# Patient Record
Sex: Male | Born: 1950 | Race: White | Hispanic: No | Marital: Married | Smoking: Former smoker
Health system: Southern US, Community
[De-identification: ages and names within clinical notes are randomized; demographics above are authoritative.]

## PROBLEM LIST (undated history)

## (undated) DIAGNOSIS — I10 Essential (primary) hypertension: Secondary | ICD-10-CM

## (undated) DIAGNOSIS — I519 Heart disease, unspecified: Secondary | ICD-10-CM

## (undated) DIAGNOSIS — Z972 Presence of dental prosthetic device (complete) (partial): Secondary | ICD-10-CM

## (undated) DIAGNOSIS — K269 Duodenal ulcer, unspecified as acute or chronic, without hemorrhage or perforation: Secondary | ICD-10-CM

## (undated) DIAGNOSIS — I509 Heart failure, unspecified: Secondary | ICD-10-CM

## (undated) DIAGNOSIS — M1990 Unspecified osteoarthritis, unspecified site: Secondary | ICD-10-CM

## (undated) DIAGNOSIS — K219 Gastro-esophageal reflux disease without esophagitis: Secondary | ICD-10-CM

## (undated) DIAGNOSIS — R1030 Lower abdominal pain, unspecified: Secondary | ICD-10-CM

## (undated) DIAGNOSIS — D126 Benign neoplasm of colon, unspecified: Secondary | ICD-10-CM

## (undated) DIAGNOSIS — E7800 Pure hypercholesterolemia, unspecified: Secondary | ICD-10-CM

## (undated) DIAGNOSIS — R7303 Prediabetes: Secondary | ICD-10-CM

## (undated) DIAGNOSIS — Z87898 Personal history of other specified conditions: Secondary | ICD-10-CM

## (undated) DIAGNOSIS — I6529 Occlusion and stenosis of unspecified carotid artery: Secondary | ICD-10-CM

## (undated) DIAGNOSIS — B1910 Unspecified viral hepatitis B without hepatic coma: Secondary | ICD-10-CM

## (undated) DIAGNOSIS — N400 Enlarged prostate without lower urinary tract symptoms: Secondary | ICD-10-CM

## (undated) DIAGNOSIS — T4145XA Adverse effect of unspecified anesthetic, initial encounter: Secondary | ICD-10-CM

## (undated) DIAGNOSIS — M75100 Unspecified rotator cuff tear or rupture of unspecified shoulder, not specified as traumatic: Secondary | ICD-10-CM

## (undated) DIAGNOSIS — N189 Chronic kidney disease, unspecified: Secondary | ICD-10-CM

## (undated) DIAGNOSIS — D649 Anemia, unspecified: Secondary | ICD-10-CM

## (undated) DIAGNOSIS — T8859XA Other complications of anesthesia, initial encounter: Secondary | ICD-10-CM

## (undated) DIAGNOSIS — I4891 Unspecified atrial fibrillation: Secondary | ICD-10-CM

## (undated) DIAGNOSIS — C801 Malignant (primary) neoplasm, unspecified: Secondary | ICD-10-CM

## (undated) DIAGNOSIS — H409 Unspecified glaucoma: Secondary | ICD-10-CM

## (undated) DIAGNOSIS — I2510 Atherosclerotic heart disease of native coronary artery without angina pectoris: Secondary | ICD-10-CM

## (undated) DIAGNOSIS — K319 Disease of stomach and duodenum, unspecified: Secondary | ICD-10-CM

## (undated) HISTORY — PX: OTHER SURGICAL HISTORY: SHX169

## (undated) HISTORY — PX: COLONOSCOPY, ESOPHAGOGASTRODUODENOSCOPY (EGD) AND ESOPHAGEAL DILATION: SHX5781

## (undated) HISTORY — PX: TONSILLECTOMY: SUR1361

## (undated) HISTORY — DX: Atherosclerotic heart disease of native coronary artery without angina pectoris: I25.10

## (undated) HISTORY — DX: Heart disease, unspecified: I51.9

## (undated) HISTORY — PX: VASCULAR SURGERY: SHX849

---

## 2003-02-13 HISTORY — PX: OTHER SURGICAL HISTORY: SHX169

## 2004-03-12 LAB — HM COLONOSCOPY: HM Colonoscopy: NORMAL

## 2005-10-29 ENCOUNTER — Ambulatory Visit: Payer: Self-pay | Admitting: Unknown Physician Specialty

## 2007-02-13 HISTORY — PX: CARDIAC CATHETERIZATION: SHX172

## 2007-11-24 ENCOUNTER — Encounter: Admission: RE | Admit: 2007-11-24 | Discharge: 2007-11-24 | Payer: Self-pay | Admitting: Cardiovascular Disease

## 2007-11-27 ENCOUNTER — Inpatient Hospital Stay (HOSPITAL_COMMUNITY): Admission: AD | Admit: 2007-11-27 | Discharge: 2007-11-28 | Payer: Self-pay | Admitting: Cardiovascular Disease

## 2008-01-12 ENCOUNTER — Ambulatory Visit: Payer: Self-pay | Admitting: Orthopedic Surgery

## 2008-03-24 ENCOUNTER — Encounter: Payer: Self-pay | Admitting: Orthopedic Surgery

## 2008-04-12 ENCOUNTER — Encounter: Payer: Self-pay | Admitting: Orthopedic Surgery

## 2008-09-21 ENCOUNTER — Ambulatory Visit: Payer: Self-pay | Admitting: Unknown Physician Specialty

## 2010-06-27 NOTE — Cardiovascular Report (Signed)
[name], [name] NO.:  [id number]   MEDICAL RECORD NO.:  [id number]          PATIENT TYPE:  INP   LOCATION:  6524                         FACILITY:  [facility]   PHYSICIAN:  Nanetta Batty, M.D.   DATE OF BIRTH:  Jun 30, 1950   DATE OF PROCEDURE:  DATE OF DISCHARGE:                            CARDIAC CATHETERIZATION   Peter Baxter is a 60 year old gentleman, patient of Dr. Alanda Amass, who has  a history of CAD, status post remote circumflex stenting in the past.  He has had new onset angina and was cathed today by Dr. Alanda Amass at the  [facility] revealing high-grade disease in the AV groove  circumflex, first diagonal branch, and PDA.  He was transferred to [facility]  via EMS and presents now for elective PCI stenting.   DESCRIPTION OF PROCEDURE:  The patient received a gram of Rocephin IV  prophylactically.  His 4-French sheath was exchanged over a short wire  using double glove technique for a 7-French short sheath.  The patient  had already received 4 baby aspirin, 6 mg of p.o. Plavix.  He received  Angiomax bolus with an ACT of 293.  Visipaque dye was used for the  entirety of the case.  Retrograde aortic pressures were monitored during  the case.   Using a 7-French XB 3.5 guide catheter along with an 14190 Asahi soft  wire and a 2.0 x 12 Voyager balloon, PTCA was performed of the AV groove  circumflex.  Stenting was performed with a 2.25 x 16 Taxus Adam drug-  eluting stent at 16 atmospheres (2.43 mm) resulting reduction of 95%  stenosis to 0% residual.   The wire was then withdrawn and redirected down the first large diagonal  branch.  The diagonal was dilated with the 2-0 Voyager and stented with  a 2.25 x 16 Adam at 14-16 atmospheres resulting reduction of 95%  stenosis to 0% residual.   Following this, the RCA was then approached with a 7-French JR-4 guide  catheter along with an 14190 Asahi soft wire and a 2.0 x 12 Voyager  balloon.  This was  then predilated and stented with a 2.25 x 12 Taxus  Adam drug-eluting stent at 16 atmospheres (2.43 mm) resulting reduction  of 95% stenosis to 0% residual.  The patient tolerated the procedure  well.  No hemodynamic, electrocardiographic sequelae.  The guidewire and  catheter were removed.  The sheath was then secured in place.  The  patient left lab in stable condition.  He will be gently hydrated  overnight.  He will be treated with aspirin and Plavix and discharge him  on the morning.  He will follow up with Dr. Alanda Amass.  He left lab in  stable condition.      Nanetta Batty, M.D.  Electronically Signed     JB/MEDQ  D:  11/27/2007  T:  11/28/2007  Job:  [id number]   cc:   Second Floor [place]  [facility]  Richard A. Alanda Amass, M.D.  Diona Fanti

## 2010-06-27 NOTE — Discharge Summary (Signed)
Peter Baxter, Peter Baxter                ACCOUNT NO.:  [id number]   MEDICAL RECORD NO.:  [id number]          PATIENT TYPE:  INP   LOCATION:  6524                         FACILITY:  [facility]   PHYSICIAN:  Nanetta Batty, M.D.   DATE OF BIRTH:  Jun 10, 1950   DATE OF ADMISSION:  11/27/2007  DATE OF DISCHARGE:  11/28/2007                               DISCHARGE SUMMARY   DISCHARGE DIAGNOSES:  1. Angina.  2. Progressive coronary artery disease, status post cardiac      catheterization at [facility] on November 27, 2007, by      Dr. Susa Griffins revealing a new high-grade mid circumflex      95%, no restenosis of the proximal circumflex stent from July 1997,      new large diagonal 1 proximal segmented 85% lesion and then a new      proximal posterior descending artery lesion of 80-85%.  He had no      significant left anterior descending disease.  He had 40% proximal      and he had a dominant right coronary artery.  He was transferred to      [facility] for intervention.  3. Status post stenting by Dr. Nanetta Batty on November 27, 2007,      with a TAXUS stent 2.25 mm x 16 mm to the circumflex, a 2.25 mm x      16 mm to the diagonal and a 2.25 x 12 mm to the posterior      descending artery.  The patient was given aspirin and Plavix and he      also had Angiomax.  4. Hyperlipidemia.  5. Right bundle-branch block.  6. Family history of coronary artery disease.  7. Remote history of smoking.  The patient quit in 1995.  8. Gastroesophageal reflux disease on Nexium.   LABS POSTPROCEDURE:  Hemoglobin 14.3, hematocrit 42.3, WBC 8.4 and  platelets 152.  Sodium 141, potassium 4.0, chloride 109, CO2 26, glucose  92, BUN 9, creatinine 0.98, CK-MB 38/1.1 and a troponin of 0.08.  Chest  x-ray done on November 24, 2007, showed no acute findings.   DISCHARGE MEDICATIONS:  1. Metoprolol 25 mg twice a day.  2. Centrum 1 tablet every day.  3. Omega-3 1000 every day.  4. Aspirin 81  mg.  He was told to increase to 2 every day.  5. Nexium 40 mg a day.  6. Simvastatin 20 mg at bedtime.  7. Nitroglycerin p.r.n. for chest pain.  8. Plavix 75 mg a day.  He was told not to stop this.   DISCHARGE INSTRUCTIONS:  He will follow up with Dr. Alanda Amass on December 09, 2007 at 4:00 p.m.  He should do no driving for 3 days.  He was told  by Dr. Nanetta Batty he could return to work on Monday.  He should do  no strenuous activity, lifting, pushing, pulling, or exercise for 5  days.  He will attend cardiac rehab in [place].   HOSPITAL COURSE:  The patient was transferred from [facility] with high-grade disease in  three vessels, needing intervention  with a recent increase in angina.  He underwent procedure with TAXUS  stenting by  Dr. Nanetta Batty.  The following day, he was stable.  He was seen by  Dr. Allyson Sabal again and considered stable for discharge home.  His blood  pressure was 97/59, pulse was 60.  He had no chest pain or shortness of  breath.  His groin was okay and labs were all okay.      Lezlie Octave, N.P.      Nanetta Batty, M.D.  Electronically Signed    BB/MEDQ  D:  11/28/2007  T:  11/29/2007  Job:  [id number]   cc:   Dr. Clearance Coots

## 2010-11-14 LAB — BASIC METABOLIC PANEL
BUN: 9
CO2: 28
Calcium: 8.4
Calcium: 8.6
GFR calc non Af Amer: >60
GFR calc non Af Amer: >60
Glucose, Bld: 103 — ABNORMAL HIGH
Potassium: 3.7
Potassium: 4
Sodium: 141

## 2010-11-14 LAB — CBC
Hemoglobin: 14.3
Hemoglobin: 14.9
MCHC: 33.7
MCV: 98.4
MCV: 98.4
Platelets: 152
RDW: 13

## 2010-11-14 LAB — DIFFERENTIAL
Basophils Absolute: 0.1
Eosinophils Absolute: 0.1
Lymphocytes Relative: 23
Lymphs Abs: 1.4
Monocytes Relative: 7
Neutro Abs: 4.2

## 2010-11-14 LAB — CARDIAC PANEL(CRET KIN+CKTOT+MB+TROPI)
CK, MB: 1
CK, MB: 1.1
Relative Index: INVALID
Relative Index: INVALID
Total CK: 38

## 2010-11-14 LAB — PROTIME-INR: Prothrombin Time: 13.7

## 2012-01-15 ENCOUNTER — Other Ambulatory Visit (HOSPITAL_COMMUNITY): Payer: Self-pay | Admitting: Cardiovascular Disease

## 2012-01-15 DIAGNOSIS — I729 Aneurysm of unspecified site: Secondary | ICD-10-CM

## 2012-01-15 DIAGNOSIS — R0989 Other specified symptoms and signs involving the circulatory and respiratory systems: Secondary | ICD-10-CM

## 2012-02-04 ENCOUNTER — Encounter (HOSPITAL_COMMUNITY): Payer: Self-pay

## 2012-04-29 ENCOUNTER — Ambulatory Visit (HOSPITAL_COMMUNITY)
Admission: RE | Admit: 2012-04-29 | Discharge: 2012-04-29 | Disposition: A | Discharge status: Home / Self Care | Payer: BC Managed Care – PPO | Source: Ambulatory Visit | Attending: Cardiovascular Disease | Admitting: Cardiovascular Disease

## 2012-04-29 DIAGNOSIS — I729 Aneurysm of unspecified site: Secondary | ICD-10-CM

## 2012-04-29 DIAGNOSIS — R0989 Other specified symptoms and signs involving the circulatory and respiratory systems: Secondary | ICD-10-CM

## 2012-04-29 NOTE — Progress Notes (Signed)
Abdominal aortic duplex was completed Larene Pickett RVT

## 2012-04-29 NOTE — Progress Notes (Signed)
Carotid duplex doppler was completed. Larene Pickett RVT

## 2012-04-29 NOTE — Progress Notes (Deleted)
Carotid and abdominal aortic duplex doppler was completed Larene Pickett RVT

## 2012-06-27 ENCOUNTER — Encounter: Payer: Self-pay | Admitting: Cardiovascular Disease

## 2012-12-17 DIAGNOSIS — I2510 Atherosclerotic heart disease of native coronary artery without angina pectoris: Secondary | ICD-10-CM | POA: Insufficient documentation

## 2012-12-18 DIAGNOSIS — E782 Mixed hyperlipidemia: Secondary | ICD-10-CM | POA: Insufficient documentation

## 2012-12-18 DIAGNOSIS — I10 Essential (primary) hypertension: Secondary | ICD-10-CM | POA: Insufficient documentation

## 2013-01-06 ENCOUNTER — Ambulatory Visit: Payer: Self-pay | Admitting: Family Medicine

## 2013-11-04 DIAGNOSIS — R7303 Prediabetes: Secondary | ICD-10-CM | POA: Insufficient documentation

## 2014-06-11 ENCOUNTER — Ambulatory Visit (INDEPENDENT_AMBULATORY_CARE_PROVIDER_SITE_OTHER): Payer: Self-pay | Admitting: Nurse Practitioner

## 2014-06-11 ENCOUNTER — Encounter: Payer: Self-pay | Admitting: Nurse Practitioner

## 2014-06-11 ENCOUNTER — Encounter (INDEPENDENT_AMBULATORY_CARE_PROVIDER_SITE_OTHER): Payer: Self-pay

## 2014-06-11 VITALS — BP 101/62 | HR 60 | Temp 97.9°F | Resp 12 | Ht 68.0 in | Wt 181.1 lb

## 2014-06-11 DIAGNOSIS — M25511 Pain in right shoulder: Secondary | ICD-10-CM

## 2014-06-11 DIAGNOSIS — Z7189 Other specified counseling: Secondary | ICD-10-CM | POA: Diagnosis not present

## 2014-06-11 DIAGNOSIS — Z7689 Persons encountering health services in other specified circumstances: Secondary | ICD-10-CM

## 2014-06-11 DIAGNOSIS — M25512 Pain in left shoulder: Secondary | ICD-10-CM | POA: Diagnosis not present

## 2014-06-11 MED ORDER — PANTOPRAZOLE SODIUM 40 MG PO TBEC: 40.0000 mg | DELAYED_RELEASE_TABLET | Freq: Every day | ORAL | Status: AC

## 2014-06-11 NOTE — Patient Instructions (Addendum)
Tumeric 500 mg twice daily for 4-6 weeks for joints.   Lots of water and keep moving!   Welcome to [facility]!

## 2014-06-11 NOTE — Progress Notes (Signed)
Pre visit review using our clinic review tool, if applicable. No additional management support is needed unless otherwise documented below in the visit note. 

## 2014-06-11 NOTE — Progress Notes (Signed)
Subjective:    Patient ID: Peter Baxter, male    DOB: 09-Jul-1950, 64 y.o.   MRN: [id number]  HPI  Peter Baxter is a 64 yo male establishing care with CC of aching shoulders.  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 Sheppard Coil at [place]  Constitutional: Negative for fever, chills, diaphoresis and fatigue.  HENT: Positive for tinnitus. Negative for trouble swallowing.        High frequency intermittent in ears   Eyes: Negative for visual disturbance.  Respiratory: Negative for chest tightness, shortness of breath and wheezing.   Cardiovascular: Negative for chest pain, palpitations and leg swelling.  Gastrointestinal: Negative for nausea, vomiting, diarrhea and constipation.  Genitourinary: Negative for difficulty urinating.  Musculoskeletal: Positive for arthralgias. Negative for back pain and neck pain.  Skin: Negative for rash.  Neurological: Negative for dizziness, weakness, numbness and headaches.  Hematological: Does not bruise/bleed easily.  Psychiatric/Behavioral: Negative for suicidal ideas and sleep disturbance. The patient is not nervous/anxious.    Past Medical History  Diagnosis Date  . Heart disease     History   Social History  . Marital Status: Married    Spouse Name: N/A  . Number of Children: N/A  . Years of Education: N/A   Occupational History  . Not on file.   Social History Main Topics  . Smoking status: Former Smoker    Types: Cigarettes  . Smokeless tobacco: Former Systems developer    Quit date: 06/12/1993  . Alcohol  Use: No  . Drug Use: No  . Sexual Activity:    Partners: Female     Comment: 1 partner    Other Topics Concern  . Not on file   Social History Narrative   Works at [employer] in [employer] with wife    Children: 2    Pets: None   Left handed    Caffeine- 2 coffee, 1 soda a day    Enjoys building, working on his mustang     Past Surgical History  Procedure Laterality Date  . Stents N/A 2005    Family History  Problem Relation Age of Onset  . Heart disease Mother   . Heart disease Father   . Heart disease Brother     No Known Allergies  No current outpatient prescriptions on file prior to visit.   No current facility-administered medications on file prior to visit.       Objective:   Physical Exam  Constitutional: He is oriented to person, place, and time. He appears well-developed and well-nourished. No distress.  BP 101/62 mmHg  Pulse 60  Temp(Src) 97.9 F (36.6 C) (Oral)  Resp 12  Ht 5\' 8"  (1.727 m)  Wt 181 lb 1.9 oz (82.155 kg)  BMI 27.55 kg/m2  SpO2 97%   HENT:  Head: Normocephalic and atraumatic.  Right Ear: External ear normal.  Left Ear: External ear normal.  Cardiovascular: Normal rate, regular rhythm, normal heart  sounds and intact distal pulses.  Exam reveals no gallop and no friction rub.   No murmur heard. Pulmonary/Chest: Effort normal and breath sounds normal. No respiratory distress. He has no wheezes. He has no rales. He exhibits no tenderness.  Neurological: He is alert and oriented to person, place, and time.  Skin: Skin is warm and dry. No rash noted. He is not diaphoretic.  Psychiatric: He has a normal mood and affect. His behavior is normal. Judgment and thought content normal.      Assessment & Plan:

## 2014-06-24 DIAGNOSIS — Z7689 Persons encountering health services in other specified circumstances: Secondary | ICD-10-CM | POA: Insufficient documentation

## 2014-06-24 DIAGNOSIS — M25519 Pain in unspecified shoulder: Secondary | ICD-10-CM | POA: Insufficient documentation

## 2014-06-24 NOTE — Assessment & Plan Note (Addendum)
Tumeric was recommended. We discussed other alternative such as medications, referrals, PT, ect. Seen at [facility] for rotator cuff issues in past. Does not want surgery. Will Follow.

## 2014-06-24 NOTE — Assessment & Plan Note (Signed)
Discussed acute and chronic issues. Reviewed health maintenance measures, PFSHx, and immunizations.   

## 2014-07-26 IMAGING — CT CT STONE STUDY
2 of 4 series · 16 of 46 positions shown, 18 images · non-contrast
Comparison: None.

CLINICAL DATA: Right lower quadrant pain, history of kidney stones

EXAM:
CT ABDOMEN AND PELVIS WITHOUT
TECHNIQUE: Multidetector CT imaging of the abdomen and pelvis was performed
following the standard protocol without IV contrast.

[Series 2: stone · axial · 0.77mm/px · z∈[-1165,-710]mm · 13 of 99 slices shown, 15 images]
[im 4/99  soft-tissue]
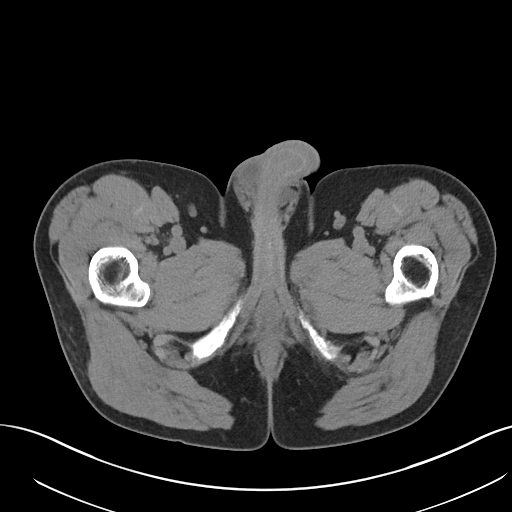
[im 4/99  bone]
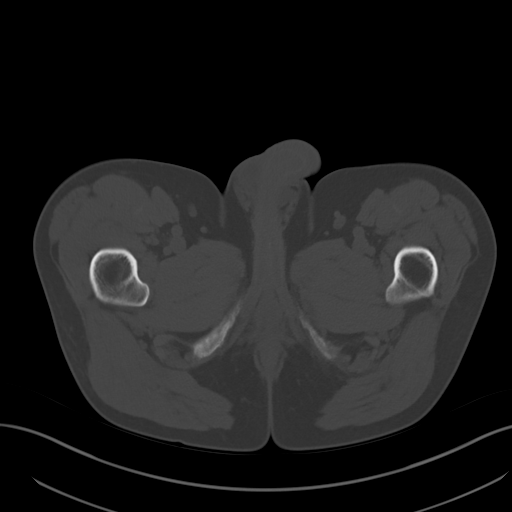
[im 12/99  soft-tissue]
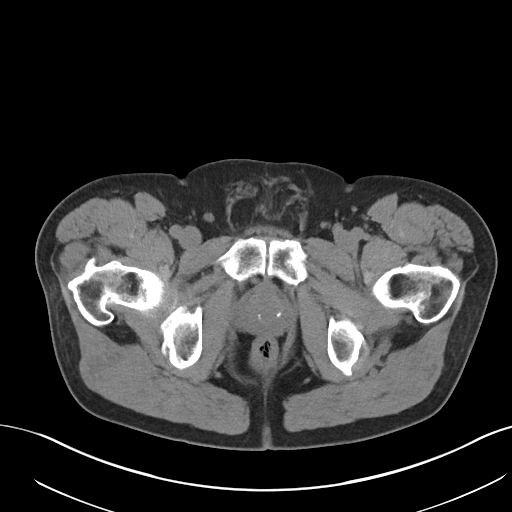
[im 20/99  soft-tissue]
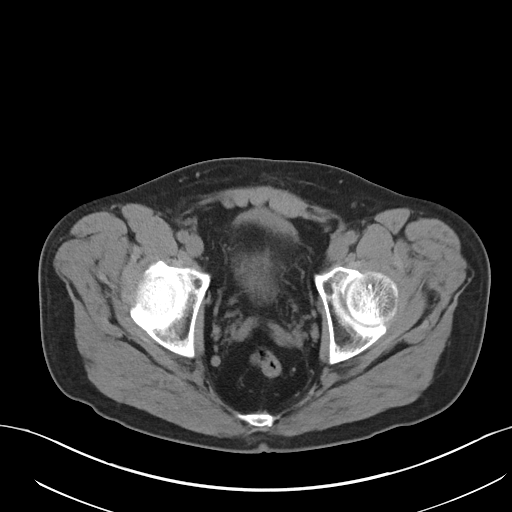
[im 28/99  soft-tissue]
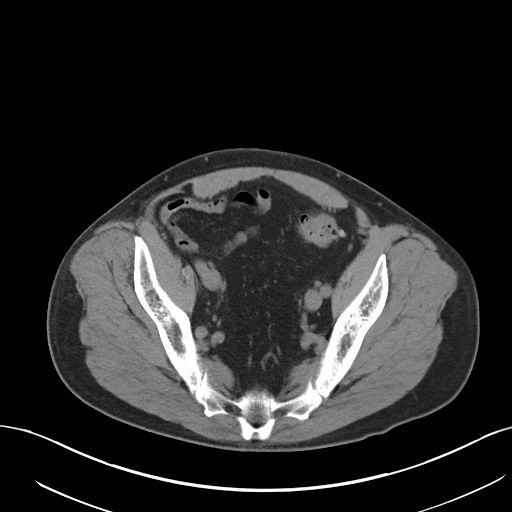
[im 36/99  soft-tissue]
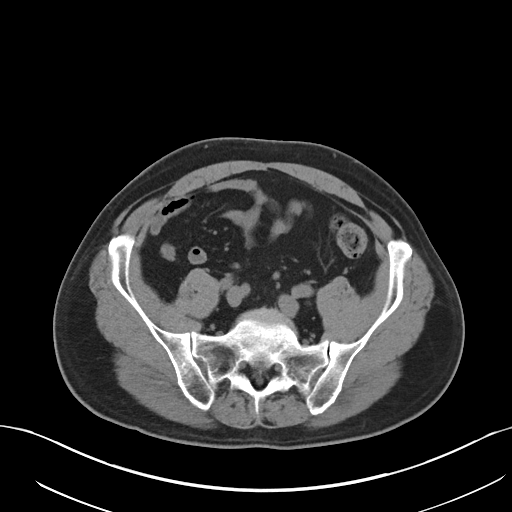
[im 44/99  soft-tissue]
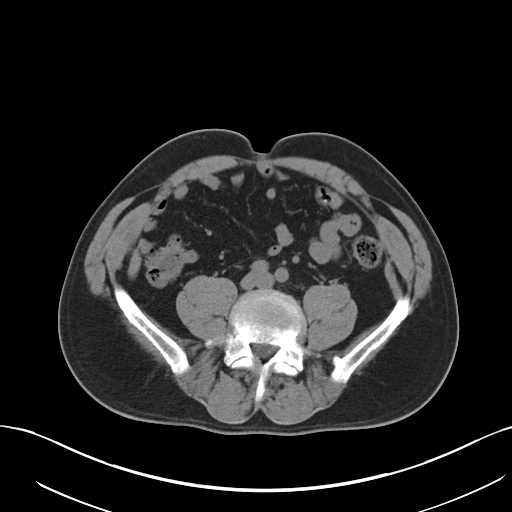
[im 51/99  soft-tissue]
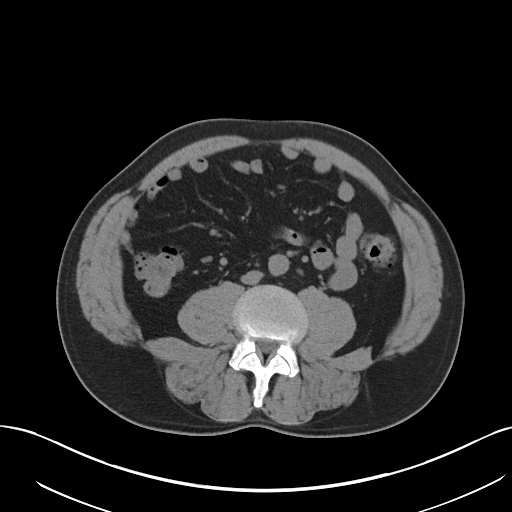
[im 55/99  soft-tissue]
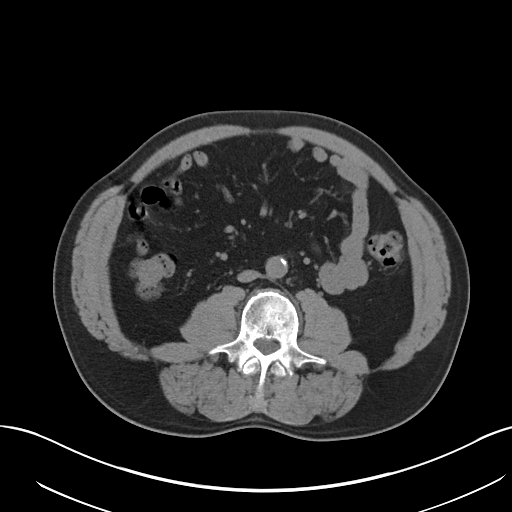
[im 63/99  soft-tissue]
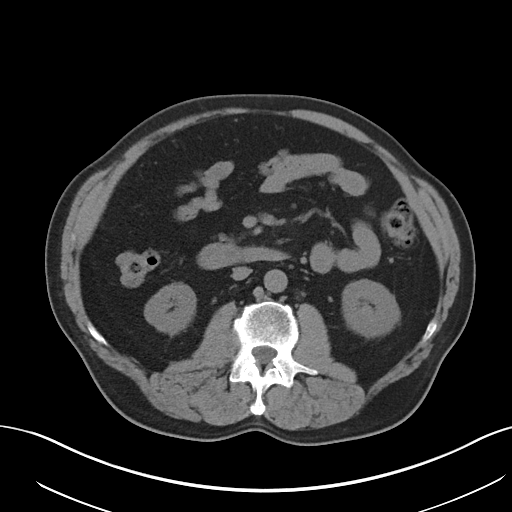
[im 63/99  bone]
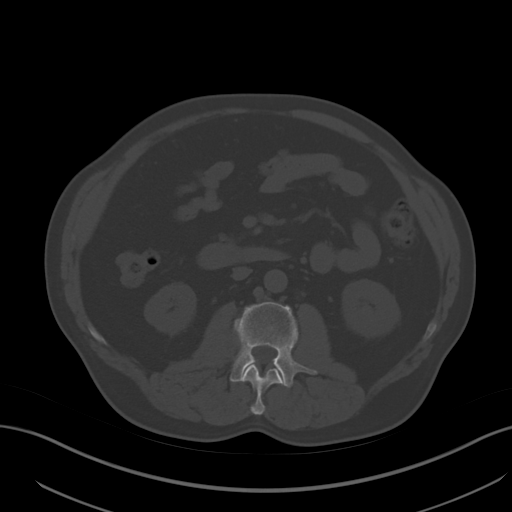
[im 71/99  soft-tissue]
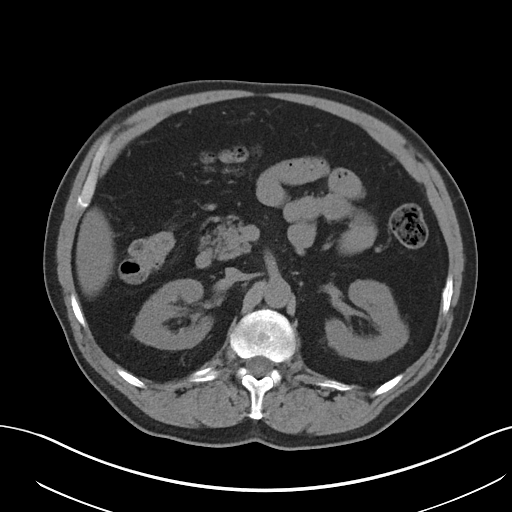
[im 79/99  soft-tissue]
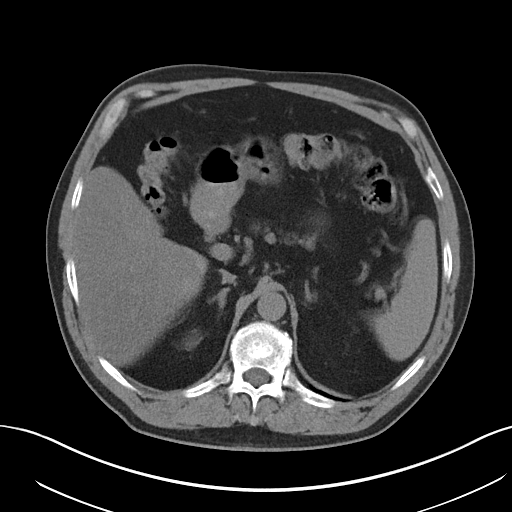
[im 87/99  soft-tissue]
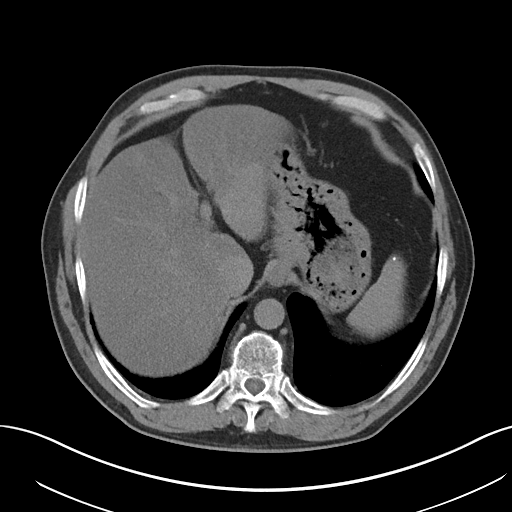
[im 95/99  soft-tissue]
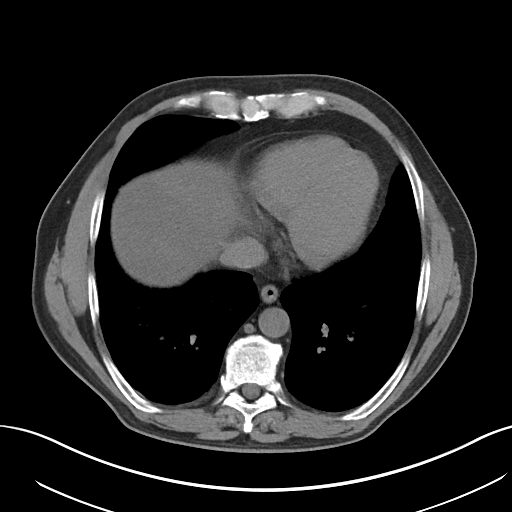

[Series 5: cor · coronal · 0.74mm/px · 3 of 147 slices shown]
[im 49/147  soft-tissue]
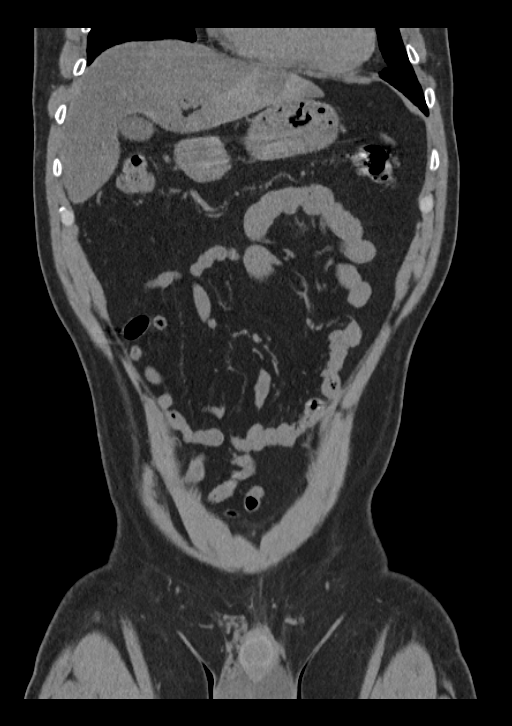
[im 65/147  soft-tissue]
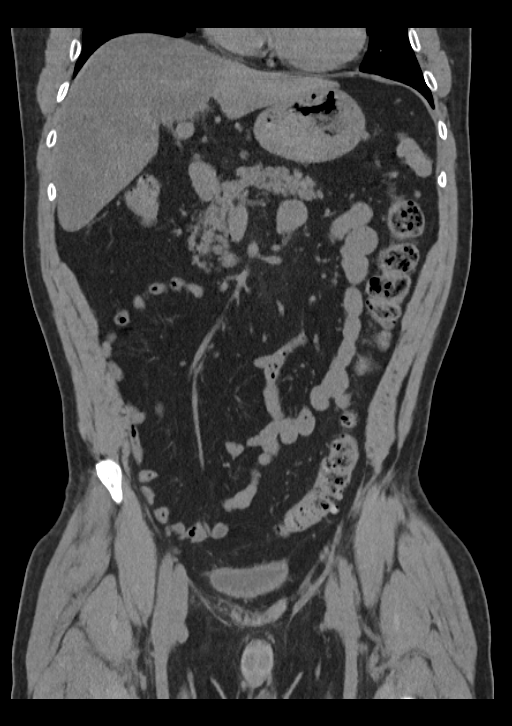
[im 82/147  soft-tissue]
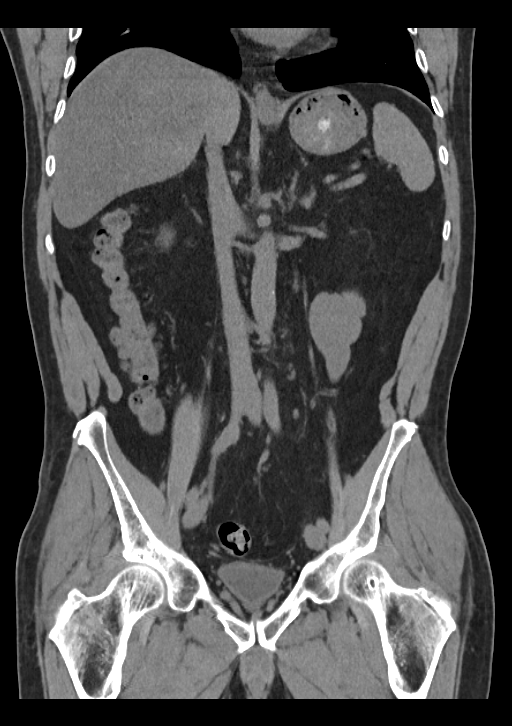

[16 of 46 positions shown; findings below may reference images not displayed]

FINDINGS: Lung bases demonstrate some mild dependent atelectasis.

Diffuse decreased attenuation is noted within the liver with the
exception of a few areas within the posterior aspect of the left
lobe of the liver consistent with fatty infiltration with some areas
of geographic sparing. The spleen shows a few small calcified
granulomas. The adrenal glands and pancreas are unremarkable in
their CT appearance. The gallbladder is well distended without
evidence of cholelithiasis.

The kidneys are well visualized bilaterally and reveal no renal
calculi or urinary tract obstructive changes. The ureters are within
normal limits bilaterally. The appendix is well visualized and
unremarkable. Scattered diverticulosis is noted without evidence of
diverticulitis. The bladder is partially distended. No pelvic mass
lesion is seen. Bilateral hydroceles are noted. No acute bony
abnormality is seen.
IMPRESSION: Fatty infiltration of the liver with areas of sparing particularly
in the posterior aspect of the left lobe of the liver.

No acute abnormality is noted.

## 2014-09-09 ENCOUNTER — Ambulatory Visit (INDEPENDENT_AMBULATORY_CARE_PROVIDER_SITE_OTHER): Payer: Medicare Other | Admitting: Nurse Practitioner

## 2014-09-09 VITALS — BP 138/78 | HR 67 | Temp 98.3°F | Resp 18 | Ht 68.0 in | Wt 179.6 lb

## 2014-09-09 DIAGNOSIS — M25519 Pain in unspecified shoulder: Secondary | ICD-10-CM | POA: Diagnosis not present

## 2014-09-09 MED ORDER — TIZANIDINE HCL 4 MG PO TABS: 4.0000 mg | ORAL_TABLET | Freq: Every day | ORAL | Status: DC

## 2014-09-09 NOTE — Progress Notes (Signed)
Pre visit review using our clinic review tool, if applicable. No additional management support is needed unless otherwise documented below in the visit note. 

## 2014-09-09 NOTE — Progress Notes (Signed)
   Subjective:    Patient ID: Peter Baxter, male    DOB: [birth year], 64 y.o.   MRN: [id number]  HPI  Peter Baxter is a 64 yo male with follow up of shoulder pain. Pt is a poor historian.   1) Tumeric not helpful, pt feels shoulders at night are "like he's been working out". Taking Glucosamine-Chondroitin   Aching, denies heavy lifting  Right > Left  X-rays of shoulder 8 years ago at [place].   Knees and shoulders bothering pt. Denies hip pains.   Review of Systems  Constitutional: Negative for fever, chills, diaphoresis and fatigue.  Eyes: Negative for visual disturbance.  Respiratory: Negative for chest tightness, shortness of breath and wheezing.   Cardiovascular: Negative for chest pain, palpitations and leg swelling.  Musculoskeletal: Positive for myalgias and arthralgias. Negative for back pain, joint swelling, gait problem, neck pain and neck stiffness.  Neurological: Negative for dizziness, weakness and numbness.  Psychiatric/Behavioral: The patient is not nervous/anxious.        Objective:   Physical Exam  Constitutional: He is oriented to person, place, and time. He appears well-developed and well-nourished. No distress.  BP 138/78 mmHg  Pulse 67  Temp(Src) 98.3 F (36.8 C)  Resp 18  Ht 5\' 8"  (1.727 m)  Wt 179 lb 9.6 oz (81.466 kg)  BMI 27.31 kg/m2  SpO2 95%   HENT:  Head: Normocephalic and atraumatic.  Right Ear: External ear normal.  Left Ear: External ear normal.  Cardiovascular: Normal rate, regular rhythm, normal heart sounds and intact distal pulses.  Exam reveals no gallop and no friction rub.   No murmur heard. Pulmonary/Chest: Effort normal and breath sounds normal. No respiratory distress. He has no wheezes. He has no rales. He exhibits no tenderness.  Musculoskeletal: Normal range of motion. He exhibits tenderness. He exhibits no edema.  Tenderness of posterior shoulders on exam. Tight trapezius muscles.   Neurological: He is alert and oriented  to person, place, and time.  Skin: Skin is warm and dry. No rash noted. He is not diaphoretic.  Psychiatric: He has a normal mood and affect. His behavior is normal. Judgment and thought content normal.          Assessment & Plan:

## 2014-09-09 NOTE — Patient Instructions (Signed)
Shoulder Exercises EXERCISES  RANGE OF MOTION (ROM) AND STRETCHING EXERCISES These exercises may help you when beginning to rehabilitate your injury. Your symptoms may resolve with or without further involvement from your physician, physical therapist or athletic trainer. While completing these exercises, remember:   Restoring tissue flexibility helps normal motion to return to the joints. This allows healthier, less painful movement and activity.  An effective stretch should be held for at least 30 seconds.  A stretch should never be painful. You should only feel a gentle lengthening or release in the stretched tissue.  Try the muscle relaxer at night (no driving or operating heavy machinery)  We will contact you about your referral to [place].

## 2014-09-19 ENCOUNTER — Encounter: Payer: Self-pay | Admitting: Nurse Practitioner

## 2014-09-19 DIAGNOSIS — M25519 Pain in unspecified shoulder: Secondary | ICD-10-CM | POA: Insufficient documentation

## 2014-09-19 NOTE — Assessment & Plan Note (Signed)
Uncontrolled. Pt reports tumeric is not helpful. He would like referral to ortho. Will follow.

## 2014-10-08 ENCOUNTER — Other Ambulatory Visit: Payer: Self-pay | Admitting: Nurse Practitioner

## 2014-10-08 ENCOUNTER — Telehealth: Payer: Self-pay | Admitting: *Deleted

## 2014-10-08 MED ORDER — METOPROLOL SUCCINATE ER 25 MG PO TB24: 12.5000 mg | ORAL_TABLET | Freq: Every day | ORAL | Status: DC

## 2014-10-08 NOTE — Telephone Encounter (Signed)
Filled as is thanks!

## 2014-10-08 NOTE — Telephone Encounter (Signed)
Please call and clarify if patient is taking half or whole of the Metoprolol XL 25 mg. Since it is extended release we do no recommend halving them, but that is how the script in the computer reads. Thanks!

## 2014-10-08 NOTE — Telephone Encounter (Signed)
Pt called requesting refill on Metoprolol 25 mg.  Pt states Rx is usually prescribed by Dr Netty Starring at [facility].  Last OV 7.28.16.  Pt requests Rx be sent to [facility].

## 2014-10-08 NOTE — Telephone Encounter (Signed)
Spoke with pt, he states he has been half.  He requests a 12.5 mg refill.  Please advise

## 2015-01-31 ENCOUNTER — Other Ambulatory Visit: Payer: Self-pay | Admitting: Nurse Practitioner

## 2015-02-13 HISTORY — PX: CORONARY ARTERY BYPASS GRAFT: SHX141

## 2015-09-11 DIAGNOSIS — I480 Paroxysmal atrial fibrillation: Secondary | ICD-10-CM | POA: Insufficient documentation

## 2015-10-31 ENCOUNTER — Encounter: Payer: Medicare Other | Attending: Surgery | Admitting: *Deleted

## 2015-10-31 VITALS — Ht 68.25 in | Wt 170.3 lb

## 2015-10-31 DIAGNOSIS — I2510 Atherosclerotic heart disease of native coronary artery without angina pectoris: Secondary | ICD-10-CM | POA: Diagnosis not present

## 2015-10-31 DIAGNOSIS — E119 Type 2 diabetes mellitus without complications: Secondary | ICD-10-CM | POA: Diagnosis not present

## 2015-10-31 DIAGNOSIS — Z87891 Personal history of nicotine dependence: Secondary | ICD-10-CM | POA: Diagnosis not present

## 2015-10-31 DIAGNOSIS — I38 Endocarditis, valve unspecified: Secondary | ICD-10-CM | POA: Insufficient documentation

## 2015-10-31 DIAGNOSIS — Z951 Presence of aortocoronary bypass graft: Secondary | ICD-10-CM | POA: Diagnosis present

## 2015-10-31 DIAGNOSIS — I509 Heart failure, unspecified: Secondary | ICD-10-CM | POA: Diagnosis not present

## 2015-10-31 DIAGNOSIS — I519 Heart disease, unspecified: Secondary | ICD-10-CM | POA: Insufficient documentation

## 2015-10-31 NOTE — Progress Notes (Signed)
Cardiac Individual Treatment Plan  Patient Details  Name: Peter Baxter MRN: [id number] Date of Birth: 08-06-1950 Referring Provider:   Flowsheet Row Cardiac Rehab from 10/31/2015 in [facility] Cardiac and Pulmonary Rehab  Referring Provider  Amaryllis Dyke MD      Initial Encounter Date:  Flowsheet Row Cardiac Rehab from 10/31/2015 in [facility] Cardiac and Pulmonary Rehab  Date  10/31/15  Referring Provider  Amaryllis Dyke MD      Visit Diagnosis: S/P CABG x 4  Patient's Home Medications on Admission:  Current Outpatient Prescriptions:  .  aspirin EC 81 MG tablet, Take by mouth., Disp: , Rfl:  .  atorvastatin (LIPITOR) 10 MG tablet, , Disp: , Rfl:  .  Glucosamine-Chondroitin-MSM 500-400-166 MG TABS, Take by mouth., Disp: , Rfl:  .  latanoprost (XALATAN) 0.005 % ophthalmic solution, , Disp: , Rfl:  .  metoprolol succinate (TOPROL-XL) 25 MG 24 hr tablet, Take 0.5 tablets (12.5 mg total) by mouth daily. TAKE 1/2 TABLET BY MOUTH EVERY DAY, Disp: 15 tablet, Rfl: 1 .  Multiple Vitamins-Minerals (CENTRUM SILVER ADULT 50+ PO), Take 1 mg by mouth., Disp: , Rfl:  .  niacin 500 MG tablet, Take by mouth., Disp: , Rfl:  .  Omega-3 Fatty Acids (FISH OIL) 1200 MG CAPS, Take by mouth., Disp: , Rfl:  .  pantoprazole (PROTONIX) 40 MG tablet, Take 1 tablet (40 mg total) by mouth daily. TAKE 1 TABLET BY MOUTH ONCE DAILY, Disp: 30 tablet, Rfl: 5 .  Saw Palmetto 450 MG CAPS, Take 1 capsule by mouth daily., Disp: , Rfl:  .  tiZANidine (ZANAFLEX) 4 MG tablet, TAKE 1 TABLET (4 MG TOTAL) BY MOUTH AT BEDTIME., Disp: 30 tablet, Rfl: 3 .  Turmeric (RA TURMERIC) 500 MG CAPS, Take 1 capsule by mouth daily., Disp: , Rfl:   Past Medical History: Past Medical History:  Diagnosis Date  . CAD (coronary artery disease)   . Heart disease     Tobacco Use: History  Smoking Status  . Former Smoker  . Types: Cigarettes  Smokeless Tobacco  . Former Systems developer  . Quit date: 06/12/1993    Labs: Recent Review Flowsheet Data     There is no flowsheet data to display.       Exercise Target Goals: Date: 10/31/15  Exercise Program Goal: Individual exercise prescription set with THRR, safety & activity barriers. Participant demonstrates ability to understand and report RPE using BORG scale, to self-measure pulse accurately, and to acknowledge the importance of the exercise prescription.  Exercise Prescription Goal: Starting with aerobic activity 30 plus minutes a day, 3 days per week for initial exercise prescription. Provide home exercise prescription and guidelines that participant acknowledges understanding prior to discharge.  Activity Barriers & Risk Stratification:     Activity Barriers & Cardiac Risk Stratification - 10/31/15 1305      Activity Barriers & Cardiac Risk Stratification   Activity Barriers Balance Concerns;Joint Problems  L shoulder rotator cuff tear   Cardiac Risk Stratification High      6 Minute Walk:     6 Minute Walk    Row Name 10/31/15 1417         6 Minute Walk   Phase Initial     Distance 1460 feet     Walk Time 6 minutes     # of Rest Breaks 0     MPH 2.77     METS 3.48     RPE 10     VO2 Peak 12.19  Symptoms No     Resting HR 76 bpm     Resting BP 126/60     Max Ex. HR 98 bpm     Max Ex. BP 128/60     2 Minute Post BP 122/60        Initial Exercise Prescription:     Initial Exercise Prescription - 10/31/15 1400      Date of Initial Exercise RX and Referring Provider   Date 10/31/15   Referring Provider Amaryllis Dyke MD     Treadmill   MPH 2.5   Grade 1.5   Minutes 15   METs 3.43     NuStep   Level 3   Minutes 15   METs 2.5     REL-XR   Level 3   Minutes 15   METs 2.5     Prescription Details   Frequency (times per week) 3   Duration Progress to 45 minutes of aerobic exercise without signs/symptoms of physical distress     Intensity   THRR 40-80% of Max Heartrate 107-138   Ratings of Perceived Exertion 11-13   Perceived  Dyspnea 0-4     Progression   Progression Continue to progress workloads to maintain intensity without signs/symptoms of physical distress.     Resistance Training   Training Prescription Yes   Weight 3 lbs   Reps 10-12      Perform Capillary Blood Glucose checks as needed.  Exercise Prescription Changes:     Exercise Prescription Changes    Row Name 10/31/15 1300             Exercise Review   Progression -  walk test results         Response to Exercise   Blood Pressure (Admit) 126/60       Blood Pressure (Exercise) 128/60       Blood Pressure (Exit) 122/60       Heart Rate (Admit) 91 bpm       Heart Rate (Exercise) 98 bpm       Heart Rate (Exit) 71 bpm       Rating of Perceived Exertion (Exercise) 10          Exercise Comments:   Discharge Exercise Prescription (Final Exercise Prescription Changes):     Exercise Prescription Changes - 10/31/15 1300      Exercise Review   Progression --  walk test results     Response to Exercise   Blood Pressure (Admit) 126/60   Blood Pressure (Exercise) 128/60   Blood Pressure (Exit) 122/60   Heart Rate (Admit) 91 bpm   Heart Rate (Exercise) 98 bpm   Heart Rate (Exit) 71 bpm   Rating of Perceived Exertion (Exercise) 10      Nutrition:  Target Goals: Understanding of nutrition guidelines, daily intake of sodium 1500mg , cholesterol 200mg , calories 30% from fat and 7% or less from saturated fats, daily to have 5 or more servings of fruits and vegetables.  Biometrics:     Pre Biometrics - 10/31/15 1420      Pre Biometrics   Height 5' 8.25" (1.734 m)   Weight 170 lb 4.8 oz (77.2 kg)   Waist Circumference 36 inches   Hip Circumference 39 inches   Waist to Hip Ratio 0.92 %   BMI (Calculated) 25.8   Single Leg Stand 5.12 seconds       Nutrition Therapy Plan and Nutrition Goals:     Nutrition Therapy & Goals - 10/31/15 1300  Intervention Plan   Intervention Prescribe, educate and counsel  regarding individualized specific dietary modifications aiming towards targeted core components such as weight, hypertension, lipid management, diabetes, heart failure and other comorbidities.   Expected Outcomes Short Term Goal: Understand basic principles of dietary content, such as calories, fat, sodium, cholesterol and nutrients.;Short Term Goal: A plan has been developed with personal nutrition goals set during dietitian appointment.;Long Term Goal: Adherence to prescribed nutrition plan.      Nutrition Discharge: Rate Your Plate Scores:     Nutrition Assessments - 10/31/15 1306      Rate Your Plate Scores   Pre Score 74   Pre Score % 82 %      Nutrition Goals Re-Evaluation:   Psychosocial: Target Goals: Acknowledge presence or absence of depression, maximize coping skills, provide positive support system. Participant is able to verbalize types and ability to use techniques and skills needed for reducing stress and depression.  Initial Review & Psychosocial Screening:     Initial Psych Review & Screening - 10/31/15 1301      Initial Review   Current issues with --  None reported     Family Dynamics   Good Support System? --  Wife, FAmily and coworkers     Barriers   Psychosocial barriers to participate in program There are no identifiable barriers or psychosocial needs.;The patient should benefit from training in stress management and relaxation.     Screening Interventions   Interventions Encouraged to exercise      Quality of Life Scores:     Quality of Life - 10/31/15 1302      Quality of Life Scores   Health/Function Pre 21 %   Socioeconomic Pre 21 %   Psych/Spiritual Pre 21 %   Family Pre 21 %   GLOBAL Pre 21 %      PHQ-9: Recent Review Flowsheet Data    Depression screen [facility] 2/9 10/31/2015   Decreased Interest 0   Down, Depressed, Hopeless 0   PHQ - 2 Score 0   Altered sleeping 0   Tired, decreased energy 0   Change in appetite 0   Feeling  bad or failure about yourself  0   Trouble concentrating 0   Moving slowly or fidgety/restless 0   Suicidal thoughts 0   PHQ-9 Score 0   Difficult doing work/chores Not difficult at all      Psychosocial Evaluation and Intervention:   Psychosocial Re-Evaluation:   Vocational Rehabilitation: Provide vocational rehab assistance to qualifying candidates.   Vocational Rehab Evaluation & Intervention:     Vocational Rehab - 10/31/15 1305      Initial Vocational Rehab Evaluation & Intervention   Assessment shows need for Vocational Rehabilitation No      Education: Education Goals: Education classes will be provided on a weekly basis, covering required topics. Participant will state understanding/return demonstration of topics presented.  Learning Barriers/Preferences:     Learning Barriers/Preferences - 10/31/15 1305      Learning Barriers/Preferences   Learning Barriers None   Learning Preferences None      Education Topics: General Nutrition Guidelines/Fats and Fiber: -Group instruction provided by verbal, written material, models and posters to present the general guidelines for heart healthy nutrition. Gives an explanation and review of dietary fats and fiber.   Controlling Sodium/Reading Food Labels: -Group verbal and written material supporting the discussion of sodium use in heart healthy nutrition. Review and explanation with models, verbal and written materials for utilization of the  food label.   Exercise Physiology & Risk Factors: - Group verbal and written instruction with models to review the exercise physiology of the cardiovascular system and associated critical values. Details cardiovascular disease risk factors and the goals associated with each risk factor.   Aerobic Exercise & Resistance Training: - Gives group verbal and written discussion on the health impact of inactivity. On the components of aerobic and resistive training programs and the  benefits of this training and how to safely progress through these programs.   Flexibility, Balance, General Exercise Guidelines: - Provides group verbal and written instruction on the benefits of flexibility and balance training programs. Provides general exercise guidelines with specific guidelines to those with heart or lung disease. Demonstration and skill practice provided.   Stress Management: - Provides group verbal and written instruction about the health risks of elevated stress, cause of high stress, and healthy ways to reduce stress.   Depression: - Provides group verbal and written instruction on the correlation between heart/lung disease and depressed mood, treatment options, and the stigmas associated with seeking treatment.   Anatomy & Physiology of the Heart: - Group verbal and written instruction and models provide basic cardiac anatomy and physiology, with the coronary electrical and arterial systems. Review of: AMI, Angina, Valve disease, Heart Failure, Cardiac Arrhythmia, Pacemakers, and the ICD.   Cardiac Procedures: - Group verbal and written instruction and models to describe the testing methods done to diagnose heart disease. Reviews the outcomes of the test results. Describes the treatment choices: Medical Management, Angioplasty, or Coronary Bypass Surgery.   Cardiac Medications: - Group verbal and written instruction to review commonly prescribed medications for heart disease. Reviews the medication, class of the drug, and side effects. Includes the steps to properly store meds and maintain the prescription regimen.   Go Sex-Intimacy & Heart Disease, Get SMART - Goal Setting: - Group verbal and written instruction through game format to discuss heart disease and the return to sexual intimacy. Provides group verbal and written material to discuss and apply goal setting through the application of the S.M.A.R.T. Method.   Other Matters of the Heart: - Provides  group verbal, written materials and models to describe Heart Failure, Angina, Valve Disease, and Diabetes in the realm of heart disease. Includes description of the disease process and treatment options available to the cardiac patient.   Exercise & Equipment Safety: - Individual verbal instruction and demonstration of equipment use and safety with use of the equipment. Flowsheet Row Cardiac Rehab from 10/31/2015 in [facility] Cardiac and Pulmonary Rehab  Date  10/31/15  Educator  SB  Instruction Review Code  2- meets goals/outcomes      Infection Prevention: - Provides verbal and written material to individual with discussion of infection control including proper hand washing and proper equipment cleaning during exercise session. Flowsheet Row Cardiac Rehab from 10/31/2015 in [facility] Cardiac and Pulmonary Rehab  Date  10/31/15  Educator  SB  Instruction Review Code  2- meets goals/outcomes      Falls Prevention: - Provides verbal and written material to individual with discussion of falls prevention and safety.   Diabetes: - Individual verbal and written instruction to review signs/symptoms of diabetes, desired ranges of glucose level fasting, after meals and with exercise. Advice that pre and post exercise glucose checks will be done for 3 sessions at entry of program.    Knowledge Questionnaire Score:     Knowledge Questionnaire Score - 10/31/15 1305      Knowledge Questionnaire  Score   Pre Score 21/28      Core Components/Risk Factors/Patient Goals at Admission:     Personal Goals and Risk Factors at Admission - 10/31/15 1300      Core Components/Risk Factors/Patient Goals on Admission    Weight Management Yes;Weight Maintenance   Intervention Weight Management: Develop a combined nutrition and exercise program designed to reach desired caloric intake, while maintaining appropriate intake of nutrient and fiber, sodium and fats, and appropriate energy expenditure required for  the weight goal.;Weight Management: Provide education and appropriate resources to help participant work on and attain dietary goals.   Admit Weight 170 lb 4.8 oz (77.2 kg)   Goal Weight: Short Term 168 lb (76.2 kg)   Goal Weight: Long Term 165 lb (74.8 kg)   Expected Outcomes Short Term: Continue to assess and modify interventions until short term weight is achieved;Long Term: Adherence to nutrition and physical activity/exercise program aimed toward attainment of established weight goal;Weight Maintenance: Understanding of the daily nutrition guidelines, which includes 25-35% calories from fat, 7% or less cal from saturated fats, less than 200mg  cholesterol, less than 1.5gm of sodium, & 5 or more servings of fruits and vegetables daily   Sedentary Yes  Does walk miles at work no formal exercise program   Intervention Provide advice, education, support and counseling about physical activity/exercise needs.;Develop an individualized exercise prescription for aerobic and resistive training based on initial evaluation findings, risk stratification, comorbidities and participant's personal goals.   Expected Outcomes Achievement of increased cardiorespiratory fitness and enhanced flexibility, muscular endurance and strength shown through measurements of functional capacity and personal statement of participant.   Increase Strength and Stamina Yes   Intervention Provide advice, education, support and counseling about physical activity/exercise needs.;Develop an individualized exercise prescription for aerobic and resistive training based on initial evaluation findings, risk stratification, comorbidities and participant's personal goals.   Expected Outcomes Achievement of increased cardiorespiratory fitness and enhanced flexibility, muscular endurance and strength shown through measurements of functional capacity and personal statement of participant.   Hypertension Yes   Intervention Provide education on  lifestyle modifcations including regular physical activity/exercise, weight management, moderate sodium restriction and increased consumption of fresh fruit, vegetables, and low fat dairy, alcohol moderation, and smoking cessation.;Monitor prescription use compliance.   Expected Outcomes Short Term: Continued assessment and intervention until BP is < 140/29mm HG in hypertensive participants. < 130/30mm HG in hypertensive participants with diabetes, heart failure or chronic kidney disease.;Long Term: Maintenance of blood pressure at goal levels.   Lipids Yes   Intervention Provide education and support for participant on nutrition & aerobic/resistive exercise along with prescribed medications to achieve LDL 70mg , HDL >40mg .   Expected Outcomes Short Term: Participant states understanding of desired cholesterol values and is compliant with medications prescribed. Participant is following exercise prescription and nutrition guidelines.;Long Term: Cholesterol controlled with medications as prescribed, with individualized exercise RX and with personalized nutrition plan. Value goals: LDL < 70mg , HDL > 40 mg.      Core Components/Risk Factors/Patient Goals Review:    Core Components/Risk Factors/Patient Goals at Discharge (Final Review):    ITP Comments:     ITP Comments    Row Name 10/31/15 1257           ITP Comments ITP created during medical review today. Documentation of Diagnosis can be found in CARE EVERYWHERE 09/28/2015 Hospital Encounter [facility] and Office visit on 10/20/2015          Comments: Initial ITP

## 2015-10-31 NOTE — Patient Instructions (Signed)
Patient Instructions  Patient Details  Name: Peter Baxter MRN: [id number] Date of Birth: [birth year] Referring Provider:  Di Kindle, MD  Below are the personal goals you chose as well as exercise and nutrition goals. Our goal is to help you keep on track towards obtaining and maintaining your goals. We will be discussing your progress on these goals with you throughout the program.  Initial Exercise Prescription:     Initial Exercise Prescription - 10/31/15 1400      Date of Initial Exercise RX and Referring Provider   Date 10/31/15   Referring Provider Amaryllis Dyke MD     Treadmill   MPH 2.5   Grade 1.5   Minutes 15   METs 3.43     NuStep   Level 3   Minutes 15   METs 2.5     REL-XR   Level 3   Minutes 15   METs 2.5     Prescription Details   Frequency (times per week) 3   Duration Progress to 45 minutes of aerobic exercise without signs/symptoms of physical distress     Intensity   THRR 40-80% of Max Heartrate 107-138   Ratings of Perceived Exertion 11-13   Perceived Dyspnea 0-4     Progression   Progression Continue to progress workloads to maintain intensity without signs/symptoms of physical distress.     Resistance Training   Training Prescription Yes   Weight 3 lbs   Reps 10-12      Exercise Goals: Frequency: Be able to perform aerobic exercise three times per week working toward 3-5 days per week.  Intensity: Work with a perceived exertion of 11 (fairly light) - 15 (hard) as tolerated. Follow your new exercise prescription and watch for changes in prescription as you progress with the program. Changes will be reviewed with you when they are made.  Duration: You should be able to do 30 minutes of continuous aerobic exercise in addition to a 5 minute warm-up and a 5 minute cool-down routine.  Nutrition Goals: Your personal nutrition goals will be established when you do your nutrition analysis with the dietician.  The following are  nutrition guidelines to follow: Cholesterol < 200mg /day Sodium < 1500mg /day Fiber: Men over 50 yrs - 30 grams per day  Personal Goals:     Personal Goals and Risk Factors at Admission - 10/31/15 1300      Core Components/Risk Factors/Patient Goals on Admission    Weight Management Yes;Weight Maintenance   Intervention Weight Management: Develop a combined nutrition and exercise program designed to reach desired caloric intake, while maintaining appropriate intake of nutrient and fiber, sodium and fats, and appropriate energy expenditure required for the weight goal.;Weight Management: Provide education and appropriate resources to help participant work on and attain dietary goals.   Admit Weight 170 lb 4.8 oz (77.2 kg)   Goal Weight: Short Term 168 lb (76.2 kg)   Goal Weight: Long Term 165 lb (74.8 kg)   Expected Outcomes Short Term: Continue to assess and modify interventions until short term weight is achieved;Long Term: Adherence to nutrition and physical activity/exercise program aimed toward attainment of established weight goal;Weight Maintenance: Understanding of the daily nutrition guidelines, which includes 25-35% calories from fat, 7% or less cal from saturated fats, less than 200mg  cholesterol, less than 1.5gm of sodium, & 5 or more servings of fruits and vegetables daily   Sedentary Yes  Does walk miles at work no formal exercise program   Intervention Provide advice,  education, support and counseling about physical activity/exercise needs.;Develop an individualized exercise prescription for aerobic and resistive training based on initial evaluation findings, risk stratification, comorbidities and participant's personal goals.   Expected Outcomes Achievement of increased cardiorespiratory fitness and enhanced flexibility, muscular endurance and strength shown through measurements of functional capacity and personal statement of participant.   Increase Strength and Stamina Yes    Intervention Provide advice, education, support and counseling about physical activity/exercise needs.;Develop an individualized exercise prescription for aerobic and resistive training based on initial evaluation findings, risk stratification, comorbidities and participant's personal goals.   Expected Outcomes Achievement of increased cardiorespiratory fitness and enhanced flexibility, muscular endurance and strength shown through measurements of functional capacity and personal statement of participant.   Hypertension Yes   Intervention Provide education on lifestyle modifcations including regular physical activity/exercise, weight management, moderate sodium restriction and increased consumption of fresh fruit, vegetables, and low fat dairy, alcohol moderation, and smoking cessation.;Monitor prescription use compliance.   Expected Outcomes Short Term: Continued assessment and intervention until BP is < 140/25mm HG in hypertensive participants. < 130/45mm HG in hypertensive participants with diabetes, heart failure or chronic kidney disease.;Long Term: Maintenance of blood pressure at goal levels.   Lipids Yes   Intervention Provide education and support for participant on nutrition & aerobic/resistive exercise along with prescribed medications to achieve LDL 70mg , HDL >40mg .   Expected Outcomes Short Term: Participant states understanding of desired cholesterol values and is compliant with medications prescribed. Participant is following exercise prescription and nutrition guidelines.;Long Term: Cholesterol controlled with medications as prescribed, with individualized exercise RX and with personalized nutrition plan. Value goals: LDL < 70mg , HDL > 40 mg.      Tobacco Use Initial Evaluation: History  Smoking Status  . Former Smoker  . Types: Cigarettes  Smokeless Tobacco  . Former Systems developer  . Quit date: 06/12/1993    Copy of goals given to participant.

## 2015-11-07 ENCOUNTER — Encounter: Payer: Medicare Other | Admitting: *Deleted

## 2015-11-07 DIAGNOSIS — Z951 Presence of aortocoronary bypass graft: Secondary | ICD-10-CM

## 2015-11-07 NOTE — Progress Notes (Signed)
Daily Session Note  Patient Details  Name: Peter Baxter MRN: [id number] Date of Birth: 1950-03-23 Referring Provider:   Flowsheet Row Cardiac Rehab from 10/31/2015 in [facility] Cardiac and Pulmonary Rehab  Referring Provider  Amaryllis Dyke MD      Encounter Date: 11/07/2015  Check In:     Session Check In - 11/07/15 0902      Check-In   Location [facility]-Cardiac & Pulmonary Rehab   Staff Present Gerlene Burdock, RN, BSN;Susanne Bice, RN, BSN, Laveda Norman, BS, ACSM CEP, Exercise Physiologist   Supervising physician immediately available to respond to emergencies See telemetry face sheet for immediately available ER MD   Medication changes reported     No   Fall or balance concerns reported    No   Warm-up and Cool-down Performed on first and last piece of equipment   Resistance Training Performed No  Arrived late. Omitted resistance training, but did participate in stretching exercises after cardio.   VAD Patient? No     Pain Assessment   Currently in Pain? No/denies   Multiple Pain Sites No         Goals Met:  Exercise tolerated well Personal goals reviewed No report of cardiac concerns or symptoms Strength training completed today  Goals Unmet:  Not Applicable  Comments: First full day of exercise!  Patient was oriented to gym and equipment including functions, settings, policies, and procedures.  Patient's individual exercise prescription and treatment plan were reviewed.  All starting workloads were established based on the results of the 6 minute walk test done at initial orientation visit.  The plan for exercise progression was also introduced and progression will be customized based on patient's performance and goals.    Dr. Emily Filbert is Medical Director for [place] and [facility].

## 2015-11-09 ENCOUNTER — Encounter: Payer: Medicare Other | Admitting: *Deleted

## 2015-11-09 DIAGNOSIS — Z951 Presence of aortocoronary bypass graft: Secondary | ICD-10-CM | POA: Diagnosis not present

## 2015-11-09 NOTE — Progress Notes (Signed)
Daily Session Note  Patient Details  Name: Peter Baxter MRN: [id number] Date of Birth: [birth year] Referring Provider:   Flowsheet Row Cardiac Rehab from 10/31/2015 in [facility] Card[facility] and Pulmonary Rehab  Referring Provider  Amaryllis Dyke MD      Encounter Date: 11/09/2015  Check In:     Session Check In - 11/09/15 0857      Check-In   Location [facility]-Cardiac & Pulmonary Rehab   Staff Present Gerlene Burdock, RN, BSN;Laureen Owens Shark, BS, RRT, Respiratory Dareen Piano, BA, ACSM CEP, Exercise Physiologist   Supervising physician immediately available to respond to emergencies See telemetry face sheet for immediately available ER MD   Medication changes reported     No   Fall or balance concerns reported    No   Warm-up and Cool-down Performed on first and last piece of equipment   Resistance Training Performed Yes   VAD Patient? No     Pain Assessment   Currently in Pain? No/denies         Goals Met:  Proper associated with RPD/PD & O2 Sat Exercise tolerated well No report of cardiac concerns or symptoms  Goals Unmet:  Not Applicable  Comments:     Dr. Emily Filbert is Medical Director for [place] and [facility].

## 2015-11-11 ENCOUNTER — Encounter: Payer: Medicare Other | Admitting: *Deleted

## 2015-11-11 DIAGNOSIS — Z951 Presence of aortocoronary bypass graft: Secondary | ICD-10-CM

## 2015-11-11 NOTE — Progress Notes (Signed)
Daily Session Note  Patient Details  Name: HASSAN BLACKSHIRE MRN: [id number] Date of Birth: Jun 23, 1950 Referring Provider:   Flowsheet Row Cardiac Rehab from 10/31/2015 in [facility] Cardiac and Pulmonary Rehab  Referring Provider  Amaryllis Dyke MD      Encounter Date: 11/11/2015  Check In:     Session Check In - 11/11/15 0917      Check-In   Staff Present Heath Lark, RN, BSN, CCRP;Mary Kellie Shropshire, RN, BSN, MA;Stacey Blanch Media, RRT, RCP, Respiratory Therapist   Supervising physician immediately available to respond to emergencies See telemetry face sheet for immediately available ER MD   Medication changes reported     No   Fall or balance concerns reported    No   Warm-up and Cool-down Performed on first and last piece of equipment   Resistance Training Performed Yes   VAD Patient? No         Goals Met:  Exercise tolerated well No report of cardiac concerns or symptoms Strength training completed today  Goals Unmet:  Not Applicable  Comments: Doing well with exercise prescription progression.    Dr. Emily Filbert is Medical Director for [place] and [facility].

## 2015-11-14 ENCOUNTER — Encounter: Payer: Medicare Other | Attending: Surgery | Admitting: *Deleted

## 2015-11-14 DIAGNOSIS — I519 Heart disease, unspecified: Secondary | ICD-10-CM | POA: Insufficient documentation

## 2015-11-14 DIAGNOSIS — I509 Heart failure, unspecified: Secondary | ICD-10-CM | POA: Insufficient documentation

## 2015-11-14 DIAGNOSIS — I38 Endocarditis, valve unspecified: Secondary | ICD-10-CM | POA: Diagnosis not present

## 2015-11-14 DIAGNOSIS — I2510 Atherosclerotic heart disease of native coronary artery without angina pectoris: Secondary | ICD-10-CM | POA: Diagnosis not present

## 2015-11-14 DIAGNOSIS — E119 Type 2 diabetes mellitus without complications: Secondary | ICD-10-CM | POA: Diagnosis not present

## 2015-11-14 DIAGNOSIS — Z87891 Personal history of nicotine dependence: Secondary | ICD-10-CM | POA: Diagnosis not present

## 2015-11-14 DIAGNOSIS — Z951 Presence of aortocoronary bypass graft: Secondary | ICD-10-CM | POA: Insufficient documentation

## 2015-11-14 NOTE — Progress Notes (Signed)
Daily Session Note  Patient Details  Name: KUZEY OGATA MRN: [id number] Date of Birth: Mar 12, 1950 Referring Provider:   Flowsheet Row Cardiac Rehab from 10/31/2015 in [facility] Cardiac and Pulmonary Rehab  Referring Provider  Amaryllis Dyke MD      Encounter Date: 11/14/2015  Check In:     Session Check In - 11/14/15 0801      Check-In   Location [facility]-Cardiac & Pulmonary Rehab   Staff Present Heath Lark, RN, BSN, Laveda Norman, BS, ACSM CEP, Exercise Physiologist;Jessica Sidney, [place], ACSM RCEP, Exercise Physiologist   Supervising physician immediately available to respond to emergencies See telemetry face sheet for immediately available ER MD   Medication changes reported     No   Fall or balance concerns reported    No   Warm-up and Cool-down Performed on first and last piece of equipment   Resistance Training Performed Yes   VAD Patient? No     Pain Assessment   Currently in Pain? No/denies   Multiple Pain Sites No         Goals Met:  Independence with exercise equipment Exercise tolerated well No report of cardiac concerns or symptoms Strength training completed today  Goals Unmet:  Not Applicable  Comments: Pt able to follow exercise prescription today without complaint.  Will continue to monitor for progression.    Dr. Emily Filbert is Medical Director for [place] and [facility].

## 2015-11-16 ENCOUNTER — Encounter: Payer: Medicare Other | Admitting: *Deleted

## 2015-11-16 DIAGNOSIS — Z951 Presence of aortocoronary bypass graft: Secondary | ICD-10-CM | POA: Diagnosis not present

## 2015-11-16 NOTE — Progress Notes (Signed)
Daily Session Note  Patient Details  Name: Peter Baxter MRN: [id number] Date of Birth: 17-Jul-1950 Referring Provider:   Flowsheet Row Cardiac Rehab from 10/31/2015 in [facility] Cardiac and Pulmonary Rehab  Referring Provider  Amaryllis Dyke MD      Encounter Date: 11/16/2015  Check In:     Session Check In - 11/16/15 0916      Check-In   Location [facility]-Cardiac & Pulmonary Rehab   Staff Present Alberteen Sam, MA, ACSM RCEP, Exercise Physiologist;Amanda Oletta Darter, BA, ACSM CEP, Exercise Physiologist;Carroll Enterkin, RN, BSN   Supervising physician immediately available to respond to emergencies See telemetry face sheet for immediately available ER MD   Medication changes reported     No   Fall or balance concerns reported    No   Warm-up and Cool-down Performed on first and last piece of equipment   Resistance Training Performed Yes   VAD Patient? No     Pain Assessment   Currently in Pain? No/denies   Multiple Pain Sites No           Exercise Prescription Changes - 11/15/15 1400      Exercise Review   Progression Yes     Response to Exercise   Blood Pressure (Admit) 132/72   Blood Pressure (Exercise) 162/82   Blood Pressure (Exit) 136/70   Heart Rate (Admit) 66 bpm   Heart Rate (Exercise) 122 bpm   Heart Rate (Exit) 64 bpm   Rating of Perceived Exertion (Exercise) 13   Symptoms none   Duration Progress to 50 minutes of aerobic without signs/symptoms of physical distress   Intensity THRR unchanged     Progression   Progression Continue to progress workloads to maintain intensity without signs/symptoms of physical distress.   Average METs 5.03     Resistance Training   Training Prescription Yes   Weight 3 lbs   Reps 10-12     Interval Training   Interval Training No     Treadmill   MPH 2.5   Grade 1.5   Minutes 15   METs 3.43     REL-XR   Level 8   Minutes 15   METs 6.8      Goals Met:  Independence with exercise equipment Exercise tolerated  well No report of cardiac concerns or symptoms Strength training completed today  Goals Unmet:  Not Applicable  Comments: Pt able to follow exercise prescription today without complaint.  Will continue to monitor for progression. Reviewed home exercise with pt today.  Pt plans to walk at work for exercise.  Reviewed THR, pulse, RPE, sign and symptoms, NTG use, and when to call 911 or MD.  Also discussed weather considerations and indoor options.  He is also considering joining the gym here after graduation to continue to exercise.  Pt voiced understanding.    Dr. Emily Filbert is Medical Director for [place] and [facility].

## 2015-11-18 ENCOUNTER — Encounter: Payer: Medicare Other | Admitting: *Deleted

## 2015-11-18 DIAGNOSIS — Z951 Presence of aortocoronary bypass graft: Secondary | ICD-10-CM | POA: Diagnosis not present

## 2015-11-18 NOTE — Progress Notes (Signed)
Daily Session Note  Patient Details  Name: Peter Baxter MRN: [id number] Date of Birth: 30-May-1950 Referring Provider:   Flowsheet Row Cardiac Rehab from 10/31/2015 in [facility] Cardiac and Pulmonary Rehab  Referring Provider  Amaryllis Dyke MD      Encounter Date: 11/18/2015  Check In:     Session Check In - 11/18/15 0929      Check-In   Location [facility]-Cardiac & Pulmonary Rehab   Staff Present Alberteen Sam, MA, ACSM RCEP, Exercise Physiologist;Carroll Enterkin, RN, BSN;Laureen Owens Shark, BS, RRT, Respiratory Therapist   Supervising physician immediately available to respond to emergencies See telemetry face sheet for immediately available ER MD   Medication changes reported     No   Fall or balance concerns reported    No   Warm-up and Cool-down Performed on first and last piece of equipment   Resistance Training Performed Yes   VAD Patient? No     Pain Assessment   Currently in Pain? No/denies   Multiple Pain Sites No         Goals Met:  Independence with exercise equipment Exercise tolerated well Personal goals reviewed No report of cardiac concerns or symptoms Strength training completed today  Goals Unmet:  Not Applicable  Comments: Pt able to follow exercise prescription today without complaint.  Will continue to monitor for progression.    Dr. Emily Filbert is Medical Director for [facility] and [facility].

## 2015-11-21 ENCOUNTER — Encounter: Payer: Medicare Other | Admitting: *Deleted

## 2015-11-21 DIAGNOSIS — Z951 Presence of aortocoronary bypass graft: Secondary | ICD-10-CM | POA: Diagnosis not present

## 2015-11-21 NOTE — Progress Notes (Signed)
Daily Session Note  Patient Details  Name: Peter Baxter MRN: [id number] Date of Birth: 1950/09/11 Referring Provider:   Flowsheet Row Cardiac Rehab from 10/31/2015 in [facility] Cardiac and Pulmonary Rehab  Referring Provider  Amaryllis Dyke MD      Encounter Date: 11/21/2015  Check In:     Session Check In - 11/21/15 0756      Check-In   Location [facility]-Cardiac & Pulmonary Rehab   Staff Present Alberteen Sam, MA, ACSM RCEP, Exercise Physiologist;Kelly Amedeo Plenty, BS, ACSM CEP, Exercise Physiologist;Carroll Enterkin, RN, BSN   Supervising physician immediately available to respond to emergencies See telemetry face sheet for immediately available ER MD   Medication changes reported     No   Fall or balance concerns reported    No   Warm-up and Cool-down Performed on first and last piece of equipment   Resistance Training Performed Yes   VAD Patient? No     Pain Assessment   Currently in Pain? No/denies   Multiple Pain Sites No         Goals Met:  Independence with exercise equipment Exercise tolerated well No report of cardiac concerns or symptoms Strength training completed today  Goals Unmet:  Not Applicable  Comments: Pt able to follow exercise prescription today without complaint.  Will continue to monitor for progression.    Dr. Emily Filbert is Medical Director for [place] and [facility].

## 2015-11-23 ENCOUNTER — Encounter: Payer: Medicare Other | Admitting: *Deleted

## 2015-11-23 ENCOUNTER — Encounter: Payer: Self-pay | Admitting: *Deleted

## 2015-11-23 DIAGNOSIS — Z951 Presence of aortocoronary bypass graft: Secondary | ICD-10-CM

## 2015-11-23 NOTE — Progress Notes (Signed)
Cardiac Individual Treatment Plan  Patient Details  Name: Peter Baxter MRN: [id number] Date of Birth: January 24, 1951 Referring Provider:   Flowsheet Row Cardiac Rehab from 10/31/2015 in [facility] Cardiac and Pulmonary Rehab  Referring Provider  Amaryllis Dyke MD      Initial Encounter Date:  Flowsheet Row Cardiac Rehab from 10/31/2015 in [facility] Cardiac and Pulmonary Rehab  Date  10/31/15  Referring Provider  Amaryllis Dyke MD      Visit Diagnosis: S/P CABG x 4  Patient's Home Medications on Admission:  Current Outpatient Prescriptions:  .  aspirin EC 81 MG tablet, Take by mouth., Disp: , Rfl:  .  atorvastatin (LIPITOR) 10 MG tablet, , Disp: , Rfl:  .  Glucosamine-Chondroitin-MSM 500-400-166 MG TABS, Take by mouth., Disp: , Rfl:  .  latanoprost (XALATAN) 0.005 % ophthalmic solution, , Disp: , Rfl:  .  metoprolol succinate (TOPROL-XL) 25 MG 24 hr tablet, Take 0.5 tablets (12.5 mg total) by mouth daily. TAKE 1/2 TABLET BY MOUTH EVERY DAY, Disp: 15 tablet, Rfl: 1 .  Multiple Vitamins-Minerals (CENTRUM SILVER ADULT 50+ PO), Take 1 mg by mouth., Disp: , Rfl:  .  niacin 500 MG tablet, Take by mouth., Disp: , Rfl:  .  Omega-3 Fatty Acids (FISH OIL) 1200 MG CAPS, Take by mouth., Disp: , Rfl:  .  pantoprazole (PROTONIX) 40 MG tablet, Take 1 tablet (40 mg total) by mouth daily. TAKE 1 TABLET BY MOUTH ONCE DAILY, Disp: 30 tablet, Rfl: 5 .  Saw Palmetto 450 MG CAPS, Take 1 capsule by mouth daily., Disp: , Rfl:  .  tiZANidine (ZANAFLEX) 4 MG tablet, TAKE 1 TABLET (4 MG TOTAL) BY MOUTH AT BEDTIME., Disp: 30 tablet, Rfl: 3 .  Turmeric (RA TURMERIC) 500 MG CAPS, Take 1 capsule by mouth daily., Disp: , Rfl:   Past Medical History: Past Medical History:  Diagnosis Date  . CAD (coronary artery disease)   . Heart disease     Tobacco Use: History  Smoking Status  . Former Smoker  . Types: Cigarettes  Smokeless Tobacco  . Former Systems developer  . Quit date: 06/12/1993    Labs: Recent Review Flowsheet Data     There is no flowsheet data to display.       Exercise Target Goals:    Exercise Program Goal: Individual exercise prescription set with THRR, safety & activity barriers. Participant demonstrates ability to understand and report RPE using BORG scale, to self-measure pulse accurately, and to acknowledge the importance of the exercise prescription.  Exercise Prescription Goal: Starting with aerobic activity 30 plus minutes a day, 3 days per week for initial exercise prescription. Provide home exercise prescription and guidelines that participant acknowledges understanding prior to discharge.  Activity Barriers & Risk Stratification:     Activity Barriers & Cardiac Risk Stratification - 10/31/15 1305      Activity Barriers & Cardiac Risk Stratification   Activity Barriers Balance Concerns;Joint Problems  L shoulder rotator cuff tear   Cardiac Risk Stratification High      6 Minute Walk:     6 Minute Walk    Row Name 10/31/15 1417         6 Minute Walk   Phase Initial     Distance 1460 feet     Walk Time 6 minutes     # of Rest Breaks 0     MPH 2.77     METS 3.48     RPE 10     VO2 Peak 12.19  Symptoms No     Resting HR 76 bpm     Resting BP 126/60     Max Ex. HR 98 bpm     Max Ex. BP 128/60     2 Minute Post BP 122/60        Initial Exercise Prescription:     Initial Exercise Prescription - 10/31/15 1400      Date of Initial Exercise RX and Referring Provider   Date 10/31/15   Referring Provider Amaryllis Dyke MD     Treadmill   MPH 2.5   Grade 1.5   Minutes 15   METs 3.43     NuStep   Level 3   Minutes 15   METs 2.5     REL-XR   Level 3   Minutes 15   METs 2.5     Prescription Details   Frequency (times per week) 3   Duration Progress to 45 minutes of aerobic exercise without signs/symptoms of physical distress     Intensity   THRR 40-80% of Max Heartrate 107-138   Ratings of Perceived Exertion 11-13   Perceived Dyspnea 0-4      Progression   Progression Continue to progress workloads to maintain intensity without signs/symptoms of physical distress.     Resistance Training   Training Prescription Yes   Weight 3 lbs   Reps 10-12      Perform Capillary Blood Glucose checks as needed.  Exercise Prescription Changes:     Exercise Prescription Changes    Row Name 10/31/15 1300 11/15/15 1400 11/16/15 0900         Exercise Review   Progression -  walk test results Yes Yes       Response to Exercise   Blood Pressure (Admit) 126/60 132/72 132/72     Blood Pressure (Exercise) 128/60 162/82 162/82     Blood Pressure (Exit) 122/60 136/70 136/70     Heart Rate (Admit) 91 bpm 66 bpm 66 bpm     Heart Rate (Exercise) 98 bpm 122 bpm 122 bpm     Heart Rate (Exit) 71 bpm 64 bpm 64 bpm     Rating of Perceived Exertion (Exercise) 10 13 13      Symptoms  - none none     Comments  -  - Home Exercise Guidelines given 11/16/15     Duration  - Progress to 50 minutes of aerobic without signs/symptoms of physical distress Progress to 50 minutes of aerobic without signs/symptoms of physical distress     Intensity  - THRR unchanged THRR unchanged       Progression   Progression  - Continue to progress workloads to maintain intensity without signs/symptoms of physical distress. Continue to progress workloads to maintain intensity without signs/symptoms of physical distress.     Average METs  - 5.03 5.03       Resistance Training   Training Prescription  - Yes Yes     Weight  - 3 lbs 3 lbs     Reps  - 10-12 10-12       Interval Training   Interval Training  - No No       Treadmill   MPH  - 2.5 2.5     Grade  - 1.5 1.5     Minutes  - 15 15     METs  - 3.43 3.43       REL-XR   Level  - 8 8     Minutes  -  15 15     METs  - 6.8 6.8       Home Exercise Plan   Plans to continue exercise at  -  - Home  walk at home, [employer] upon graduation?     Frequency  -  - Add 2 additional days to program exercise sessions.         Exercise Comments:     Exercise Comments    Row Name 11/07/15 [id number] 11/15/15 1417 11/16/15 0920       Exercise Comments First full day of exercise!  Patient was oriented to gym and equipment including functions, settings, policies, and procedures.  Patient's individual exercise prescription and treatment plan were reviewed.  All starting workloads were established based on the results of the 6 minute walk test done at initial orientation visit.  The plan for exercise progression was also introduced and progression will be customized based on patient's performance and goals. Peter Baxter is off to a great start in rehab! He is already doing level 8 on the XR!  We will continue to monitor his progression. Reviewed home exercise with pt today.  Pt plans to walk at work for exercise.  Reviewed THR, pulse, RPE, sign and symptoms, NTG use, and when to call 911 or MD.  Also discussed weather considerations and indoor options.  He is also considering joining the gym here after graduation to continue to exercise.  Pt voiced understanding.        Discharge Exercise Prescription (Final Exercise Prescription Changes):     Exercise Prescription Changes - 11/16/15 0900      Exercise Review   Progression Yes     Response to Exercise   Blood Pressure (Admit) 132/72   Blood Pressure (Exercise) 162/82   Blood Pressure (Exit) 136/70   Heart Rate (Admit) 66 bpm   Heart Rate (Exercise) 122 bpm   Heart Rate (Exit) 64 bpm   Rating of Perceived Exertion (Exercise) 13   Symptoms none   Comments Home Exercise Guidelines given 11/16/15   Duration Progress to 50 minutes of aerobic without signs/symptoms of physical distress   Intensity THRR unchanged     Progression   Progression Continue to progress workloads to maintain intensity without signs/symptoms of physical distress.   Average METs 5.03     Resistance Training   Training Prescription Yes   Weight 3 lbs   Reps 10-12     Interval Training    Interval Training No     Treadmill   MPH 2.5   Grade 1.5   Minutes 15   METs 3.43     REL-XR   Level 8   Minutes 15   METs 6.8     Home Exercise Plan   Plans to continue exercise at Home  walk at home, [employer] upon graduation?   Frequency Add 2 additional days to program exercise sessions.      Nutrition:  Target Goals: Understanding of nutrition guidelines, daily intake of sodium 1500mg , cholesterol 200mg , calories 30% from fat and 7% or less from saturated fats, daily to have 5 or more servings of fruits and vegetables.  Biometrics:     Pre Biometrics - 10/31/15 1420      Pre Biometrics   Height 5' 8.25" (1.734 m)   Weight 170 lb 4.8 oz (77.2 kg)   Waist Circumference 36 inches   Hip Circumference 39 inches   Waist to Hip Ratio 0.92 %   BMI (Calculated) 25.8   Single  Leg Stand 5.12 seconds       Nutrition Therapy Plan and Nutrition Goals:     Nutrition Therapy & Goals - 10/31/15 1300      Intervention Plan   Intervention Prescribe, educate and counsel regarding individualized specific dietary modifications aiming towards targeted core components such as weight, hypertension, lipid management, diabetes, heart failure and other comorbidities.   Expected Outcomes Short Term Goal: Understand basic principles of dietary content, such as calories, fat, sodium, cholesterol and nutrients.;Short Term Goal: A plan has been developed with personal nutrition goals set during dietitian appointment.;Long Term Goal: Adherence to prescribed nutrition plan.      Nutrition Discharge: Rate Your Plate Scores:     Nutrition Assessments - 10/31/15 1306      Rate Your Plate Scores   Pre Score 74   Pre Score % 82 %      Nutrition Goals Re-Evaluation:   Psychosocial: Target Goals: Acknowledge presence or absence of depression, maximize coping skills, provide positive support system. Participant is able to verbalize types and ability to use techniques and skills needed  for reducing stress and depression.  Initial Review & Psychosocial Screening:     Initial Psych Review & Screening - 10/31/15 1301      Initial Review   Current issues with --  None reported     Family Dynamics   Good Support System? --  Wife, FAmily and coworkers     Barriers   Psychosocial barriers to participate in program There are no identifiable barriers or psychosocial needs.;The patient should benefit from training in stress management and relaxation.     Screening Interventions   Interventions Encouraged to exercise      Quality of Life Scores:     Quality of Life - 10/31/15 1302      Quality of Life Scores   Health/Function Pre 21 %   Socioeconomic Pre 21 %   Psych/Spiritual Pre 21 %   Family Pre 21 %   GLOBAL Pre 21 %      PHQ-9: Recent Review Flowsheet Data    Depression screen [facility] 2/9 10/31/2015   Decreased Interest 0   Down, Depressed, Hopeless 0   PHQ - 2 Score 0   Altered sleeping 0   Tired, decreased energy 0   Change in appetite 0   Feeling bad or failure about yourself  0   Trouble concentrating 0   Moving slowly or fidgety/restless 0   Suicidal thoughts 0   PHQ-9 Score 0   Difficult doing work/chores Not difficult at all      Psychosocial Evaluation and Intervention:   Psychosocial Re-Evaluation:   Vocational Rehabilitation: Provide vocational rehab assistance to qualifying candidates.   Vocational Rehab Evaluation & Intervention:     Vocational Rehab - 10/31/15 1305      Initial Vocational Rehab Evaluation & Intervention   Assessment shows need for Vocational Rehabilitation No      Education: Education Goals: Education classes will be provided on a weekly basis, covering required topics. Participant will state understanding/return demonstration of topics presented.  Learning Barriers/Preferences:     Learning Barriers/Preferences - 10/31/15 1305      Learning Barriers/Preferences   Learning Barriers None    Learning Preferences None      Education Topics: General Nutrition Guidelines/Fats and Fiber: -Group instruction provided by verbal, written material, models and posters to present the general guidelines for heart healthy nutrition. Gives an explanation and review of dietary fats and fiber.  Controlling Sodium/Reading Food Labels: -Group verbal and written material supporting the discussion of sodium use in heart healthy nutrition. Review and explanation with models, verbal and written materials for utilization of the food label. Flowsheet Row Cardiac Rehab from 11/21/2015 in [facility] Cardiac and Pulmonary Rehab  Date  11/07/15  Educator  CR  Instruction Review Code  2- meets goals/outcomes      Exercise Physiology & Risk Factors: - Group verbal and written instruction with models to review the exercise physiology of the cardiovascular system and associated critical values. Details cardiovascular disease risk factors and the goals associated with each risk factor. Flowsheet Row Cardiac Rehab from 11/21/2015 in [facility] Cardiac and Pulmonary Rehab  Date  11/14/15  Educator  [facility]  Instruction Review Code  2- meets goals/outcomes      Aerobic Exercise & Resistance Training: - Gives group verbal and written discussion on the health impact of inactivity. On the components of aerobic and resistive training programs and the benefits of this training and how to safely progress through these programs. Flowsheet Row Cardiac Rehab from 11/21/2015 in [facility] Cardiac and Pulmonary Rehab  Date  11/16/15  Educator  [facility]  Instruction Review Code  2- meets goals/outcomes      Flexibility, Balance, General Exercise Guidelines: - Provides group verbal and written instruction on the benefits of flexibility and balance training programs. Provides general exercise guidelines with specific guidelines to those with heart or lung disease. Demonstration and skill practice provided. Flowsheet Row Cardiac Rehab from  11/21/2015 in [facility] Cardiac and Pulmonary Rehab  Date  11/21/15  Educator  [facility]  Instruction Review Code  2- meets goals/outcomes      Stress Management: - Provides group verbal and written instruction about the health risks of elevated stress, cause of high stress, and healthy ways to reduce stress.   Depression: - Provides group verbal and written instruction on the correlation between heart/lung disease and depressed mood, treatment options, and the stigmas associated with seeking treatment.   Anatomy & Physiology of the Heart: - Group verbal and written instruction and models provide basic cardiac anatomy and physiology, with the coronary electrical and arterial systems. Review of: AMI, Angina, Valve disease, Heart Failure, Cardiac Arrhythmia, Pacemakers, and the ICD.   Cardiac Procedures: - Group verbal and written instruction and models to describe the testing methods done to diagnose heart disease. Reviews the outcomes of the test results. Describes the treatment choices: Medical Management, Angioplasty, or Coronary Bypass Surgery.   Cardiac Medications: - Group verbal and written instruction to review commonly prescribed medications for heart disease. Reviews the medication, class of the drug, and side effects. Includes the steps to properly store meds and maintain the prescription regimen.   Go Sex-Intimacy & Heart Disease, Get SMART - Goal Setting: - Group verbal and written instruction through game format to discuss heart disease and the return to sexual intimacy. Provides group verbal and written material to discuss and apply goal setting through the application of the S.M.A.R.T. Method.   Other Matters of the Heart: - Provides group verbal, written materials and models to describe Heart Failure, Angina, Valve Disease, and Diabetes in the realm of heart disease. Includes description of the disease process and treatment options available to the cardiac patient.   Exercise &  Equipment Safety: - Individual verbal instruction and demonstration of equipment use and safety with use of the equipment. Flowsheet Row Cardiac Rehab from 11/21/2015 in [facility] Cardiac and Pulmonary Rehab  Date  10/31/15  Educator  [facility]  Instruction Review Code  2- meets goals/outcomes      Infection Prevention: - Provides verbal and written material to individual with discussion of infection control including proper hand washing and proper equipment cleaning during exercise session. Flowsheet Row Cardiac Rehab from 11/21/2015 in [facility] Cardiac and Pulmonary Rehab  Date  10/31/15  Educator  SB  Instruction Review Code  2- meets goals/outcomes      Falls Prevention: - Provides verbal and written material to individual with discussion of falls prevention and safety.   Diabetes: - Individual verbal and written instruction to review signs/symptoms of diabetes, desired ranges of glucose level fasting, after meals and with exercise. Advice that pre and post exercise glucose checks will be done for 3 sessions at entry of program.    Knowledge Questionnaire Score:     Knowledge Questionnaire Score - 10/31/15 1305      Knowledge Questionnaire Score   Pre Score 21/28      Core Components/Risk Factors/Patient Goals at Admission:     Personal Goals and Risk Factors at Admission - 10/31/15 1300      Core Components/Risk Factors/Patient Goals on Admission    Weight Management Yes;Weight Maintenance   Intervention Weight Management: Develop a combined nutrition and exercise program designed to reach desired caloric intake, while maintaining appropriate intake of nutrient and fiber, sodium and fats, and appropriate energy expenditure required for the weight goal.;Weight Management: Provide education and appropriate resources to help participant work on and attain dietary goals.   Admit Weight 170 lb 4.8 oz (77.2 kg)   Goal Weight: Short Term 168 lb (76.2 kg)   Goal Weight: Long Term 165 lb  (74.8 kg)   Expected Outcomes Short Term: Continue to assess and modify interventions until short term weight is achieved;Long Term: Adherence to nutrition and physical activity/exercise program aimed toward attainment of established weight goal;Weight Maintenance: Understanding of the daily nutrition guidelines, which includes 25-35% calories from fat, 7% or less cal from saturated fats, less than 200mg  cholesterol, less than 1.5gm of sodium, & 5 or more servings of fruits and vegetables daily   Sedentary Yes  Does walk miles at work no formal exercise program   Intervention Provide advice, education, support and counseling about physical activity/exercise needs.;Develop an individualized exercise prescription for aerobic and resistive training based on initial evaluation findings, risk stratification, comorbidities and participant's personal goals.   Expected Outcomes Achievement of increased cardiorespiratory fitness and enhanced flexibility, muscular endurance and strength shown through measurements of functional capacity and personal statement of participant.   Increase Strength and Stamina Yes   Intervention Provide advice, education, support and counseling about physical activity/exercise needs.;Develop an individualized exercise prescription for aerobic and resistive training based on initial evaluation findings, risk stratification, comorbidities and participant's personal goals.   Expected Outcomes Achievement of increased cardiorespiratory fitness and enhanced flexibility, muscular endurance and strength shown through measurements of functional capacity and personal statement of participant.   Hypertension Yes   Intervention Provide education on lifestyle modifcations including regular physical activity/exercise, weight management, moderate sodium restriction and increased consumption of fresh fruit, vegetables, and low fat dairy, alcohol moderation, and smoking cessation.;Monitor prescription  use compliance.   Expected Outcomes Short Term: Continued assessment and intervention until BP is < 140/9mm HG in hypertensive participants. < 130/60mm HG in hypertensive participants with diabetes, heart failure or chronic kidney disease.;Long Term: Maintenance of blood pressure at goal levels.   Lipids Yes   Intervention Provide education and support for participant on  nutrition & aerobic/resistive exercise along with prescribed medications to achieve LDL 70mg , HDL >40mg .   Expected Outcomes Short Term: Participant states understanding of desired cholesterol values and is compliant with medications prescribed. Participant is following exercise prescription and nutrition guidelines.;Long Term: Cholesterol controlled with medications as prescribed, with individualized exercise RX and with personalized nutrition plan. Value goals: LDL < 70mg , HDL > 40 mg.      Core Components/Risk Factors/Patient Goals Review:      Goals and Risk Factor Review    Row Name 11/16/15 0917             Core Components/Risk Factors/Patient Goals Review   Personal Goals Review Weight Management/Obesity;Sedentary;Increase Strength and Stamina;Hypertension;Lipids       Review Peter Baxter is off to a good start in rehab.  He has lost 20 lbs since his event and feels better as his strength is returning.  He would like to meet with RD to work on diet improvements.  No problems with his statins so far.  He is interested in starting high intensity interval training.  He also reviewed home exercise guidelines today.       Expected Outcomes Peter Baxter will continue to come to exercise and education classes to work on risk factor modifications.          Core Components/Risk Factors/Patient Goals at Discharge (Final Review):      Goals and Risk Factor Review - 11/16/15 0917      Core Components/Risk Factors/Patient Goals Review   Personal Goals Review Weight Management/Obesity;Sedentary;Increase Strength and  Stamina;Hypertension;Lipids   Review Peter Baxter is off to a good start in rehab.  He has lost 20 lbs since his event and feels better as his strength is returning.  He would like to meet with RD to work on diet improvements.  No problems with his statins so far.  He is interested in starting high intensity interval training.  He also reviewed home exercise guidelines today.   Expected Outcomes Peter Baxter will continue to come to exercise and education classes to work on risk factor modifications.      ITP Comments:     ITP Comments    Row Name 10/31/15 1257 11/23/15 1123         ITP Comments ITP created during medical review today. Documentation of Diagnosis can be found in CARE EVERYWHERE 09/28/2015 Hospital Encounter [facility] and Office visit on 10/20/2015 30 day review. Continue with ITP unless changes noted by Medical Director at signature of review.         Comments:

## 2015-11-23 NOTE — Progress Notes (Signed)
Daily Session Note  Patient Details  Name: Peter Baxter MRN: [id number] Date of Birth: 02-21-1950 Referring Provider:   Flowsheet Row Cardiac Rehab from 10/31/2015 in [facility] Cardiac and Pulmonary Rehab  Referring Provider  Amaryllis Dyke MD      Encounter Date: 11/23/2015  Check In:     Session Check In - 11/23/15 0929      Check-In   Staff Present Alberteen Sam, MA, ACSM RCEP, Exercise Physiologist;Susanne Bice, RN, BSN, CCRP;Laureen Owens Shark, BS, RRT, Respiratory Therapist   Supervising physician immediately available to respond to emergencies See telemetry face sheet for immediately available ER MD   Medication changes reported     No   Fall or balance concerns reported    No   Warm-up and Cool-down Performed on first and last piece of equipment   Resistance Training Performed Yes   VAD Patient? No     Pain Assessment   Currently in Pain? No/denies   Multiple Pain Sites No         Goals Met:  Independence with exercise equipment Exercise tolerated well No report of cardiac concerns or symptoms Strength training completed today  Goals Unmet:  Not Applicable  Comments: Pt able to follow exercise prescription today without complaint.  Will continue to monitor for progression.    Dr. Emily Filbert is Medical Director for [facility].

## 2015-11-28 ENCOUNTER — Encounter: Payer: Medicare Other | Admitting: *Deleted

## 2015-11-28 DIAGNOSIS — Z951 Presence of aortocoronary bypass graft: Secondary | ICD-10-CM | POA: Diagnosis not present

## 2015-11-28 NOTE — Progress Notes (Signed)
Daily Session Note  Patient Details  Name: Peter Baxter MRN: [id number] Date of Birth: 1951-01-24 Referring Provider:   Flowsheet Row Cardiac Rehab from 10/31/2015 in [facility] Cardiac and Pulmonary Rehab  Referring Provider  Amaryllis Dyke MD      Encounter Date: 11/28/2015  Check In:     Session Check In - 11/28/15 0808      Check-In   Location [facility]-Cardiac & Pulmonary Rehab   Staff Present Gerlene Burdock, RN, Moises Blood, BS, ACSM CEP, Exercise Physiologist;Jessica Luan Pulling, [place], ACSM RCEP, Exercise Physiologist   Supervising physician immediately available to respond to emergencies See telemetry face sheet for immediately available ER MD   Medication changes reported     No   Fall or balance concerns reported    No   Warm-up and Cool-down Performed on first and last piece of equipment   Resistance Training Performed Yes   VAD Patient? No     Pain Assessment   Currently in Pain? No/denies   Multiple Pain Sites No         Goals Met:  Independence with exercise equipment Exercise tolerated well No report of cardiac concerns or symptoms Strength training completed today  Goals Unmet:  Not Applicable  Comments: Pt able to follow exercise prescription today without complaint.  Will continue to monitor for progression.    Dr. Emily Filbert is Medical Director for [place] and [facility].

## 2015-12-02 ENCOUNTER — Encounter: Payer: Medicare Other | Admitting: *Deleted

## 2015-12-02 DIAGNOSIS — Z951 Presence of aortocoronary bypass graft: Secondary | ICD-10-CM

## 2015-12-02 NOTE — Progress Notes (Signed)
Daily Session Note  Patient Details  Name: TAVIOUS GRIESINGER MRN: [id number] Date of Birth: 01-Jul-1950 Referring Provider:   Flowsheet Row Cardiac Rehab from 10/31/2015 in [facility] Cardiac and Pulmonary Rehab  Referring Provider  Amaryllis Dyke MD      Encounter Date: 12/02/2015  Check In:     Session Check In - 12/02/15 0758      Check-In   Location [facility]-Cardiac & Pulmonary Rehab   Staff Present Gerlene Burdock, RN, BSN;Mary Kellie Shropshire, RN, BSN, Willette Pa, MA, ACSM RCEP, Exercise Physiologist   Supervising physician immediately available to respond to emergencies See telemetry face sheet for immediately available ER MD   Medication changes reported     No   Fall or balance concerns reported    No   Warm-up and Cool-down Performed on first and last piece of equipment   Resistance Training Performed Yes   VAD Patient? No     Pain Assessment   Currently in Pain? No/denies         Goals Met:  Proper associated with RPD/PD & O2 Sat No report of cardiac concerns or symptoms  Goals Unmet:  Not Applicable  Comments:    Dr. Emily Filbert is Medical Director for [place] and [facility].

## 2015-12-05 ENCOUNTER — Encounter: Payer: Medicare Other | Admitting: *Deleted

## 2015-12-05 DIAGNOSIS — Z951 Presence of aortocoronary bypass graft: Secondary | ICD-10-CM

## 2015-12-05 NOTE — Progress Notes (Signed)
Daily Session Note  Patient Details  Name: Peter Baxter MRN: [id number] Date of Birth: 1950-03-08 Referring Provider:   Flowsheet Row Cardiac Rehab from 10/31/2015 in [facility] Cardiac and Pulmonary Rehab  Referring Provider  Amaryllis Dyke MD      Encounter Date: 12/05/2015  Check In:     Session Check In - 12/05/15 0744      Check-In   Location [facility]-Cardiac & Pulmonary Rehab   Staff Present Gerlene Burdock, RN, Moises Blood, BS, ACSM CEP, Exercise Physiologist;Jessica Luan Pulling, MA, ACSM RCEP, Exercise Physiologist   Supervising physician immediately available to respond to emergencies See telemetry face sheet for immediately available ER MD   Medication changes reported     No   Fall or balance concerns reported    No   Warm-up and Cool-down Performed on first and last piece of equipment   Resistance Training Performed Yes   VAD Patient? No     Pain Assessment   Currently in Pain? No/denies   Multiple Pain Sites No         Goals Met:  Independence with exercise equipment Exercise tolerated well No report of cardiac concerns or symptoms Strength training completed today  Goals Unmet:  Not Applicable  Comments: Pt able to follow exercise prescription today without complaint.  Will continue to monitor for progression.    Dr. Emily Filbert is Medical Director for [place] and [facility].

## 2015-12-09 ENCOUNTER — Encounter: Payer: Medicare Other | Admitting: *Deleted

## 2015-12-09 DIAGNOSIS — Z951 Presence of aortocoronary bypass graft: Secondary | ICD-10-CM | POA: Diagnosis not present

## 2015-12-09 NOTE — Progress Notes (Signed)
Daily Session Note  Patient Details  Name: Peter Baxter MRN: [id number] Date of Birth: 07/06/1950 Referring Provider:   Flowsheet Row Cardiac Rehab from 10/31/2015 in [facility] Cardiac and Pulmonary Rehab  Referring Provider  Amaryllis Dyke MD      Encounter Date: 12/09/2015  Check In:     Session Check In - 12/09/15 0908      Check-In   Location [facility]-Cardiac & Pulmonary Rehab   Staff Present Gerlene Burdock, RN, BSN;Susanne Bice, RN, BSN, CCRP;Jessica Luan Pulling, MA, ACSM RCEP, Exercise Physiologist   Supervising physician immediately available to respond to emergencies See telemetry face sheet for immediately available ER MD   Medication changes reported     No   Fall or balance concerns reported    No   Warm-up and Cool-down Performed on first and last piece of equipment   Resistance Training Performed Yes   VAD Patient? No         Goals Met:  Proper associated with RPD/PD & O2 Sat Exercise tolerated well  Goals Unmet:  Not Applicable  Comments:     Dr. Emily Filbert is Medical Director for [place] and [facility].

## 2015-12-14 ENCOUNTER — Encounter: Payer: Medicare Other | Attending: Surgery | Admitting: *Deleted

## 2015-12-14 DIAGNOSIS — E119 Type 2 diabetes mellitus without complications: Secondary | ICD-10-CM | POA: Insufficient documentation

## 2015-12-14 DIAGNOSIS — I509 Heart failure, unspecified: Secondary | ICD-10-CM | POA: Diagnosis not present

## 2015-12-14 DIAGNOSIS — I38 Endocarditis, valve unspecified: Secondary | ICD-10-CM | POA: Diagnosis not present

## 2015-12-14 DIAGNOSIS — Z951 Presence of aortocoronary bypass graft: Secondary | ICD-10-CM | POA: Diagnosis present

## 2015-12-14 DIAGNOSIS — Z87891 Personal history of nicotine dependence: Secondary | ICD-10-CM | POA: Insufficient documentation

## 2015-12-14 DIAGNOSIS — I519 Heart disease, unspecified: Secondary | ICD-10-CM | POA: Insufficient documentation

## 2015-12-14 DIAGNOSIS — I2510 Atherosclerotic heart disease of native coronary artery without angina pectoris: Secondary | ICD-10-CM | POA: Insufficient documentation

## 2015-12-14 NOTE — Progress Notes (Signed)
Daily Session Note  Patient Details  Name: Peter Baxter MRN: [id number] Date of Birth: 1950/03/06 Referring Provider:   Flowsheet Row Cardiac Rehab from 10/31/2015 in [facility] Cardiac and Pulmonary Rehab  Referring Provider  Amaryllis Dyke MD      Encounter Date: 12/14/2015  Check In:     Session Check In - 12/14/15 0836      Check-In   Location [facility]-Cardiac & Pulmonary Rehab   Staff Present Alberteen Sam, MA, ACSM RCEP, Exercise Physiologist;Susanne Bice, RN, BSN, Lance Sell, BA, ACSM CEP, Exercise Physiologist   Supervising physician immediately available to respond to emergencies See telemetry face sheet for immediately available ER MD   Medication changes reported     No   Fall or balance concerns reported    No   Warm-up and Cool-down Performed on first and last piece of equipment   Resistance Training Performed Yes   VAD Patient? No     Pain Assessment   Currently in Pain? No/denies   Multiple Pain Sites No         Goals Met:  Independence with exercise equipment Exercise tolerated well No report of cardiac concerns or symptoms Strength training completed today  Goals Unmet:  Not Applicable  Comments: Pt able to follow exercise prescription today without complaint.  Will continue to monitor for progression.    Dr. Emily Filbert is Medical Director for [place] and [facility].

## 2015-12-16 ENCOUNTER — Encounter: Payer: Medicare Other | Admitting: *Deleted

## 2015-12-16 DIAGNOSIS — Z951 Presence of aortocoronary bypass graft: Secondary | ICD-10-CM | POA: Diagnosis not present

## 2015-12-16 NOTE — Progress Notes (Signed)
Daily Session Note  Patient Details  Name: PANAYIOTIS RAINVILLE MRN: [id number] Date of Birth: March 10, 1950 Referring Provider:   Flowsheet Row Cardiac Rehab from 10/31/2015 in [facility] Cardiac and Pulmonary Rehab  Referring Provider  Amaryllis Dyke MD      Encounter Date: 12/16/2015  Check In:     Session Check In - 12/16/15 0931      Check-In   Location [facility]-Cardiac & Pulmonary Rehab   Staff Present Alberteen Sam, MA, ACSM RCEP, Exercise Physiologist;Susanne Bice, RN, BSN, CCRP;Laureen Owens Shark, BS, RRT, Respiratory Therapist;Other   Supervising physician immediately available to respond to emergencies See telemetry face sheet for immediately available ER MD   Medication changes reported     No   Fall or balance concerns reported    No   Warm-up and Cool-down Performed on first and last piece of equipment   Resistance Training Performed No  left early for appt   VAD Patient? No     Pain Assessment   Currently in Pain? No/denies   Multiple Pain Sites No         Goals Met:  Independence with exercise equipment Exercise tolerated well No report of cardiac concerns or symptoms Strength training completed today  Goals Unmet:  Not Applicable  Comments: Pt able to follow exercise prescription today without complaint.  Will continue to monitor for progression.    Dr. Emily Filbert is Medical Director for [place] and [facility].

## 2015-12-19 ENCOUNTER — Encounter: Payer: Medicare Other | Admitting: *Deleted

## 2015-12-19 DIAGNOSIS — Z951 Presence of aortocoronary bypass graft: Secondary | ICD-10-CM | POA: Diagnosis not present

## 2015-12-19 NOTE — Progress Notes (Signed)
Daily Session Note  Patient Details  Name: Peter Baxter MRN: [id number] Date of Birth: 12-10-1950 Referring Provider:   Flowsheet Row Cardiac Rehab from 10/31/2015 in [facility] Cardiac and Pulmonary Rehab  Referring Provider  Amaryllis Dyke MD      Encounter Date: 12/19/2015  Check In:     Session Check In - 12/19/15 0749      Check-In   Location [facility]-Cardiac & Pulmonary Rehab   Staff Present Gerlene Burdock, RN, Moises Blood, BS, ACSM CEP, Exercise Physiologist;Jessica Luan Pulling, MA, ACSM RCEP, Exercise Physiologist   Supervising physician immediately available to respond to emergencies See telemetry face sheet for immediately available ER MD   Medication changes reported     No   Fall or balance concerns reported    No   Warm-up and Cool-down Performed on first and last piece of equipment   Resistance Training Performed Yes   VAD Patient? No     Pain Assessment   Currently in Pain? No/denies   Multiple Pain Sites No         Goals Met:  Independence with exercise equipment Exercise tolerated well No report of cardiac concerns or symptoms Strength training completed today  Goals Unmet:  Not Applicable  Comments: Pt able to follow exercise prescription today without complaint.  Will continue to monitor for progression.    Dr. Emily Filbert is Medical Director for [place] and [facility].

## 2015-12-21 ENCOUNTER — Encounter: Payer: Self-pay | Admitting: *Deleted

## 2015-12-21 DIAGNOSIS — Z951 Presence of aortocoronary bypass graft: Secondary | ICD-10-CM

## 2015-12-21 NOTE — Progress Notes (Signed)
Cardiac Individual Treatment Plan  Patient Details  Name: Peter Baxter MRN: [id number] Date of Birth: January 24, 1951 Referring Provider:   Flowsheet Row Cardiac Rehab from 10/31/2015 in [facility] Cardiac and Pulmonary Rehab  Referring Provider  Amaryllis Dyke MD      Initial Encounter Date:  Flowsheet Row Cardiac Rehab from 10/31/2015 in [facility] Cardiac and Pulmonary Rehab  Date  10/31/15  Referring Provider  Amaryllis Dyke MD      Visit Diagnosis: S/P CABG x 4  Patient's Home Medications on Admission:  Current Outpatient Prescriptions:  .  aspirin EC 81 MG tablet, Take by mouth., Disp: , Rfl:  .  atorvastatin (LIPITOR) 10 MG tablet, , Disp: , Rfl:  .  Glucosamine-Chondroitin-MSM 500-400-166 MG TABS, Take by mouth., Disp: , Rfl:  .  latanoprost (XALATAN) 0.005 % ophthalmic solution, , Disp: , Rfl:  .  metoprolol succinate (TOPROL-XL) 25 MG 24 hr tablet, Take 0.5 tablets (12.5 mg total) by mouth daily. TAKE 1/2 TABLET BY MOUTH EVERY DAY, Disp: 15 tablet, Rfl: 1 .  Multiple Vitamins-Minerals (CENTRUM SILVER ADULT 50+ PO), Take 1 mg by mouth., Disp: , Rfl:  .  niacin 500 MG tablet, Take by mouth., Disp: , Rfl:  .  Omega-3 Fatty Acids (FISH OIL) 1200 MG CAPS, Take by mouth., Disp: , Rfl:  .  pantoprazole (PROTONIX) 40 MG tablet, Take 1 tablet (40 mg total) by mouth daily. TAKE 1 TABLET BY MOUTH ONCE DAILY, Disp: 30 tablet, Rfl: 5 .  Saw Palmetto 450 MG CAPS, Take 1 capsule by mouth daily., Disp: , Rfl:  .  tiZANidine (ZANAFLEX) 4 MG tablet, TAKE 1 TABLET (4 MG TOTAL) BY MOUTH AT BEDTIME., Disp: 30 tablet, Rfl: 3 .  Turmeric (RA TURMERIC) 500 MG CAPS, Take 1 capsule by mouth daily., Disp: , Rfl:   Past Medical History: Past Medical History:  Diagnosis Date  . CAD (coronary artery disease)   . Heart disease     Tobacco Use: History  Smoking Status  . Former Smoker  . Types: Cigarettes  Smokeless Tobacco  . Former Systems developer  . Quit date: 06/12/1993    Labs: Recent Review Flowsheet Data     There is no flowsheet data to display.       Exercise Target Goals:    Exercise Program Goal: Individual exercise prescription set with THRR, safety & activity barriers. Participant demonstrates ability to understand and report RPE using BORG scale, to self-measure pulse accurately, and to acknowledge the importance of the exercise prescription.  Exercise Prescription Goal: Starting with aerobic activity 30 plus minutes a day, 3 days per week for initial exercise prescription. Provide home exercise prescription and guidelines that participant acknowledges understanding prior to discharge.  Activity Barriers & Risk Stratification:     Activity Barriers & Cardiac Risk Stratification - 10/31/15 1305      Activity Barriers & Cardiac Risk Stratification   Activity Barriers Balance Concerns;Joint Problems  L shoulder rotator cuff tear   Cardiac Risk Stratification High      6 Minute Walk:     6 Minute Walk    Row Name 10/31/15 1417         6 Minute Walk   Phase Initial     Distance 1460 feet     Walk Time 6 minutes     # of Rest Breaks 0     MPH 2.77     METS 3.48     RPE 10     VO2 Peak 12.19  Symptoms No     Resting HR 76 bpm     Resting BP 126/60     Max Ex. HR 98 bpm     Max Ex. BP 128/60     2 Minute Post BP 122/60        Initial Exercise Prescription:     Initial Exercise Prescription - 10/31/15 1400      Date of Initial Exercise RX and Referring Provider   Date 10/31/15   Referring Provider Amaryllis Dyke MD     Treadmill   MPH 2.5   Grade 1.5   Minutes 15   METs 3.43     NuStep   Level 3   Minutes 15   METs 2.5     REL-XR   Level 3   Minutes 15   METs 2.5     Prescription Details   Frequency (times per week) 3   Duration Progress to 45 minutes of aerobic exercise without signs/symptoms of physical distress     Intensity   THRR 40-80% of Max Heartrate 107-138   Ratings of Perceived Exertion 11-13   Perceived Dyspnea 0-4      Progression   Progression Continue to progress workloads to maintain intensity without signs/symptoms of physical distress.     Resistance Training   Training Prescription Yes   Weight 3 lbs   Reps 10-12      Perform Capillary Blood Glucose checks as needed.  Exercise Prescription Changes:     Exercise Prescription Changes    Row Name 10/31/15 1300 11/15/15 1400 11/16/15 0900 11/30/15 1400 12/14/15 1500     Exercise Review   Progression -  walk test results Yes Yes Yes Yes     Response to Exercise   Blood Pressure (Admit) 126/60 132/72 132/72 128/64 124/60   Blood Pressure (Exercise) 128/60 162/82 162/82 142/74 152/66   Blood Pressure (Exit) 122/60 136/70 136/70 124/70 102/56   Heart Rate (Admit) 91 bpm 66 bpm 66 bpm 85 bpm 53 bpm   Heart Rate (Exercise) 98 bpm 122 bpm 122 bpm 149 bpm 130 bpm   Heart Rate (Exit) 71 bpm 64 bpm 64 bpm 97 bpm 83 bpm   Rating of Perceived Exertion (Exercise) '10 13 13 15 14   ' Symptoms  - none none none none   Comments  -  - Home Exercise Guidelines given 11/16/15 Home Exercise Guidelines given 11/16/15 Home Exercise Guidelines given 11/16/15   Duration  - Progress to 50 minutes of aerobic without signs/symptoms of physical distress Progress to 50 minutes of aerobic without signs/symptoms of physical distress Progress to 45 minutes of aerobic exercise without signs/symptoms of physical distress Progress to 45 minutes of aerobic exercise without signs/symptoms of physical distress   Intensity  - THRR unchanged THRR unchanged THRR unchanged THRR unchanged     Progression   Progression  - Continue to progress workloads to maintain intensity without signs/symptoms of physical distress. Continue to progress workloads to maintain intensity without signs/symptoms of physical distress. Continue to progress workloads to maintain intensity without signs/symptoms of physical distress. Continue to progress workloads to maintain intensity without signs/symptoms of  physical distress.   Average METs  - 5.03 5.03 5.78 7.86     Resistance Training   Training Prescription  - Yes Yes Yes Yes   Weight  - 3 lbs 3 lbs 4 lbs 4 lbs   Reps  - 10-12 10-12 10-12 10-12     Interval Training   Interval Training  -  No No Yes Yes   Equipment  -  -  - Treadmill;REL-XR;NuStep Treadmill;REL-XR;NuStep   Comments  -  -  - 2 min 30 sec 2 min 30 sec     Treadmill   MPH  - 2.5 2.5 3.6 4   Grade  - 1.5 1.'5 3 4   ' Minutes  - '15 15 15 15   ' METs  - 3.43 3.43 5.25 6.27     NuStep   Level  -  -  - 6 10   Minutes  -  -  - 15 15   METs  -  -  - 4.3 7     REL-XR   Level  - '8 8 8 12   ' Minutes  - '15 15 15 15   ' METs  - 6.8 6.8 7.8 10.3     Home Exercise Plan   Plans to continue exercise at  -  - Home  walk at home, [employer] upon graduation? Home  walk at home, [employer] upon graduation? Home  walk at home, [employer] upon graduation?   Frequency  -  - Add 2 additional days to program exercise sessions. Add 2 additional days to program exercise sessions. Add 2 additional days to program exercise sessions.      Exercise Comments:     Exercise Comments    Row Name 11/07/15 8592 11/15/15 1417 11/16/15 0920 11/30/15 1449 12/14/15 1512   Exercise Comments First full day of exercise!  Patient was oriented to gym and equipment including functions, settings, policies, and procedures.  Patient's individual exercise prescription and treatment plan were reviewed.  All starting workloads were established based on the results of the 6 minute walk test done at initial orientation visit.  The plan for exercise progression was also introduced and progression will be customized based on patient's performance and goals. Ronalee Belts is off to a great start in rehab! He is already doing level 8 on the XR!  We will continue to monitor his progression. Reviewed home exercise with pt today.  Pt plans to walk at work for exercise.  Reviewed THR, pulse, RPE, sign and symptoms, NTG use, and when to  call 911 or MD.  Also discussed weather considerations and indoor options.  He is also considering joining the gym here after graduation to continue to exercise.  Pt voiced understanding. Ronalee Belts continues to do well with exercise.  He is up to 7.8 METs on the XR.  He is enjoying the interval training.  We will continue to monitor his progression. Ronalee Belts is doing well in rehab.  He is enjoying the HIIT program.  He is now up to level 12  on the XR.  We will continue to monitor his progress.   [place] Name 12/16/15 1032           Exercise Comments Reviewed METs average and discussed progression with pt today.          Discharge Exercise Prescription (Final Exercise Prescription Changes):     Exercise Prescription Changes - 12/14/15 1500      Exercise Review   Progression Yes     Response to Exercise   Blood Pressure (Admit) 124/60   Blood Pressure (Exercise) 152/66   Blood Pressure (Exit) 102/56   Heart Rate (Admit) 53 bpm   Heart Rate (Exercise) 130 bpm   Heart Rate (Exit) 83 bpm   Rating of Perceived Exertion (Exercise) 14   Symptoms none   Comments Home Exercise Guidelines  given 11/16/15   Duration Progress to 45 minutes of aerobic exercise without signs/symptoms of physical distress   Intensity THRR unchanged     Progression   Progression Continue to progress workloads to maintain intensity without signs/symptoms of physical distress.   Average METs 7.86     Resistance Training   Training Prescription Yes   Weight 4 lbs   Reps 10-12     Interval Training   Interval Training Yes   Equipment Treadmill;REL-XR;NuStep   Comments 2 min 30 sec     Treadmill   MPH 4   Grade 4   Minutes 15   METs 6.27     NuStep   Level 10   Minutes 15   METs 7     REL-XR   Level 12   Minutes 15   METs 10.3     Home Exercise Plan   Plans to continue exercise at Home  walk at home, [employer] upon graduation?   Frequency Add 2 additional days to program exercise sessions.       Nutrition:  Target Goals: Understanding of nutrition guidelines, daily intake of sodium <1581m, cholesterol <2024m calories 30% from fat and 7% or less from saturated fats, daily to have 5 or more servings of fruits and vegetables.  Biometrics:     Pre Biometrics - 10/31/15 1420      Pre Biometrics   Height 5' 8.25" (1.734 m)   Weight 170 lb 4.8 oz (77.2 kg)   Waist Circumference 36 inches   Hip Circumference 39 inches   Waist to Hip Ratio 0.92 %   BMI (Calculated) 25.8   Single Leg Stand 5.12 seconds       Nutrition Therapy Plan and Nutrition Goals:     Nutrition Therapy & Goals - 12/02/15 0856      Nutrition Therapy   Drug/Food Interactions Statins/Certain Fruits  I made an individual Cardiac Rehab REgisterd Dietician appt.  for"Mike"      Personal Nutrition Goals   Comments MiRonalee Beltsaid he has a lot going on but he tries to eat healthy.       Nutrition Discharge: Rate Your Plate Scores:     Nutrition Assessments - 10/31/15 1306      Rate Your Plate Scores   Pre Score 74   Pre Score % 82 %      Nutrition Goals Re-Evaluation:     Nutrition Goals Re-Evaluation    Row Name 12/02/15 0857             Personal Goal #1 Re-Evaluation   Comments MiRonalee Beltsaid he has a lot going on but he tries to eat healthy. Today I made an individual [facility]hab [facility]         Psychosocial: Target Goals: Acknowledge presence or absence of depression, maximize coping skills, provide positive support system. Participant is able to verbalize types and ability to use techniques and skills needed for reducing stress and depression.  Initial Review & Psychosocial Screening:     Initial Psych Review & Screening - 10/31/15 1301      Initial Review   Current issues with --  None reported     Family Dynamics   Good Support System? --  Wife, FAmily and coworkers     Barriers   Psychosocial barriers to participate in program There are no  identifiable barriers or psychosocial needs.;The patient should benefit from training in stress management and relaxation.  Screening Interventions   Interventions Encouraged to exercise      Quality of Life Scores:     Quality of Life - 10/31/15 1302      Quality of Life Scores   Health/Function Pre 21 %   Socioeconomic Pre 21 %   Psych/Spiritual Pre 21 %   Family Pre 21 %   GLOBAL Pre 21 %      PHQ-9: Recent Review Flowsheet Data    Depression screen [facility] 2/9 10/31/2015   Decreased Interest 0   Down, Depressed, Hopeless 0   PHQ - 2 Score 0   Altered sleeping 0   Tired, decreased energy 0   Change in appetite 0   Feeling bad or failure about yourself  0   Trouble concentrating 0   Moving slowly or fidgety/restless 0   Suicidal thoughts 0   PHQ-9 Score 0   Difficult doing work/chores Not difficult at all      Psychosocial Evaluation and Intervention:     Psychosocial Evaluation - 12/05/15 0911      Psychosocial Evaluation & Interventions   Interventions Encouraged to exercise with the program and follow exercise prescription;Stress management education   Comments Counselor met with Mr. Deaton today for initial psychosocial evaluation.  He is a 65 year old who had open heart surgery several months ago.  He has a spouse of 85 years and (2) adult children who live close by; as well as active involvement in his local church.  Mr. B reports he is having some difficulty sleeping due to shoulder pain from previous injuries, but his appetite is pretty good.  He denies a history or current symptoms of depression or anxiety and reports his mood is generally positive.  He has stress in his life with his own health, but his mother and brother also suffer from heart disease and his spouse has MS; which is more stressful for him at this time.  Mr. B thinks that his spouse needs to get some help for depression and he is actively pursuing that.  He reports this will help with his stress  if she gets the help she needs.  He has goals to increase his stamina and strength to be able to go back to work.  He plans to maintain consistency in exercising by walking in the future.  Staff will follow with Mr. B throughout the course of this program.        Psychosocial Re-Evaluation:     Psychosocial Re-Evaluation    [place] Name 12/02/15 0810             Psychosocial Re-Evaluation   Interventions Encouraged to attend Cardiac Rehabilitation for the exercise       Comments Ronalee Belts said he has had a lot going on with taking home his brother in law from the hospital who had a heart attack and his mother has been in and out of the hospital with heart failure. Plus he works at [employer] many days when he leaves [employer]. He said he did lose 20lbs but wants to lose  few more and is down to 169.5lbs. He does walk alot at his job like over 10k steps.          Vocational Rehabilitation: Provide vocational rehab assistance to qualifying candidates.   Vocational Rehab Evaluation & Intervention:     Vocational Rehab - 10/31/15 1305      Initial Vocational Rehab Evaluation & Intervention   Assessment shows need for Vocational  Rehabilitation No      Education: Education Goals: Education classes will be provided on a weekly basis, covering required topics. Participant will state understanding/return demonstration of topics presented.  Learning Barriers/Preferences:     Learning Barriers/Preferences - 10/31/15 1305      Learning Barriers/Preferences   Learning Barriers None   Learning Preferences None      Education Topics: General Nutrition Guidelines/Fats and Fiber: -Group instruction provided by verbal, written material, models and posters to present the general guidelines for heart healthy nutrition. Gives an explanation and review of dietary fats and fiber.   Controlling Sodium/Reading Food Labels: -Group verbal and written material supporting the  discussion of sodium use in heart healthy nutrition. Review and explanation with models, verbal and written materials for utilization of the food label. Flowsheet Row Cardiac Rehab from 12/19/2015 in [facility] Cardiac and Pulmonary Rehab  Date  11/07/15  Educator  CR  Instruction Review Code  2- meets goals/outcomes      Exercise Physiology & Risk Factors: - Group verbal and written instruction with models to review the exercise physiology of the cardiovascular system and associated critical values. Details cardiovascular disease risk factors and the goals associated with each risk factor. Flowsheet Row Cardiac Rehab from 12/19/2015 in [facility] Cardiac and Pulmonary Rehab  Date  11/14/15  Educator  [facility]  Instruction Review Code  2- meets goals/outcomes      Aerobic Exercise & Resistance Training: - Gives group verbal and written discussion on the health impact of inactivity. On the components of aerobic and resistive training programs and the benefits of this training and how to safely progress through these programs. Flowsheet Row Cardiac Rehab from 12/19/2015 in [facility] Cardiac and Pulmonary Rehab  Date  11/16/15  Educator  [facility]  Instruction Review Code  2- meets goals/outcomes      Flexibility, Balance, General Exercise Guidelines: - Provides group verbal and written instruction on the benefits of flexibility and balance training programs. Provides general exercise guidelines with specific guidelines to those with heart or lung disease. Demonstration and skill practice provided. Flowsheet Row Cardiac Rehab from 12/19/2015 in [facility] Cardiac and Pulmonary Rehab  Date  11/21/15  Educator  [facility]  Instruction Review Code  2- meets goals/outcomes      Stress Management: - Provides group verbal and written instruction about the health risks of elevated stress, cause of high stress, and healthy ways to reduce stress.   Depression: - Provides group verbal and written instruction on the correlation between  heart/lung disease and depressed mood, treatment options, and the stigmas associated with seeking treatment.   Anatomy & Physiology of the Heart: - Group verbal and written instruction and models provide basic cardiac anatomy and physiology, with the coronary electrical and arterial systems. Review of: AMI, Angina, Valve disease, Heart Failure, Cardiac Arrhythmia, Pacemakers, and the ICD. Flowsheet Row Cardiac Rehab from 12/19/2015 in [facility] Cardiac and Pulmonary Rehab  Date  11/28/15  Educator  CE  Instruction Review Code  2- meets goals/outcomes      Cardiac Procedures: - Group verbal and written instruction and models to describe the testing methods done to diagnose heart disease. Reviews the outcomes of the test results. Describes the treatment choices: Medical Management, Angioplasty, or Coronary Bypass Surgery. Flowsheet Row Cardiac Rehab from 12/19/2015 in [facility] Cardiac and Pulmonary Rehab  Date  12/05/15  Educator  CE  Instruction Review Code  2- meets goals/outcomes      Cardiac Medications: - Group verbal and written instruction to  review commonly prescribed medications for heart disease. Reviews the medication, class of the drug, and side effects. Includes the steps to properly store meds and maintain the prescription regimen.   Go Sex-Intimacy & Heart Disease, Get SMART - Goal Setting: - Group verbal and written instruction through game format to discuss heart disease and the return to sexual intimacy. Provides group verbal and written material to discuss and apply goal setting through the application of the S.M.A.R.T. Method. Flowsheet Row Cardiac Rehab from 12/19/2015 in [facility] Cardiac and Pulmonary Rehab  Date  12/05/15  Educator  CE  Instruction Review Code  2- meets goals/outcomes      Other Matters of the Heart: - Provides group verbal, written materials and models to describe Heart Failure, Angina, Valve Disease, and Diabetes in the realm of heart disease. Includes  description of the disease process and treatment options available to the cardiac patient. Flowsheet Row Cardiac Rehab from 12/19/2015 in [facility] Cardiac and Pulmonary Rehab  Date  11/28/15  Educator  CE  Instruction Review Code  2- meets goals/outcomes      Exercise & Equipment Safety: - Individual verbal instruction and demonstration of equipment use and safety with use of the equipment. Flowsheet Row Cardiac Rehab from 12/19/2015 in [facility] Cardiac and Pulmonary Rehab  Date  10/31/15  Educator  SB  Instruction Review Code  2- meets goals/outcomes      Infection Prevention: - Provides verbal and written material to individual with discussion of infection control including proper hand washing and proper equipment cleaning during exercise session. Flowsheet Row Cardiac Rehab from 12/19/2015 in [facility] Cardiac and Pulmonary Rehab  Date  10/31/15  Educator  SB  Instruction Review Code  2- meets goals/outcomes      Falls Prevention: - Provides verbal and written material to individual with discussion of falls prevention and safety.   Diabetes: - Individual verbal and written instruction to review signs/symptoms of diabetes, desired ranges of glucose level fasting, after meals and with exercise. Advice that pre and post exercise glucose checks will be done for 3 sessions at entry of program.    Knowledge Questionnaire Score:     Knowledge Questionnaire Score - 10/31/15 1305      Knowledge Questionnaire Score   Pre Score 21/28      Core Components/Risk Factors/Patient Goals at Admission:     Personal Goals and Risk Factors at Admission - 10/31/15 1300      Core Components/Risk Factors/Patient Goals on Admission    Weight Management Yes;Weight Maintenance   Intervention Weight Management: Develop a combined nutrition and exercise program designed to reach desired caloric intake, while maintaining appropriate intake of nutrient and fiber, sodium and fats, and appropriate energy  expenditure required for the weight goal.;Weight Management: Provide education and appropriate resources to help participant work on and attain dietary goals.   Admit Weight 170 lb 4.8 oz (77.2 kg)   Goal Weight: Short Term 168 lb (76.2 kg)   Goal Weight: Long Term 165 lb (74.8 kg)   Expected Outcomes Short Term: Continue to assess and modify interventions until short term weight is achieved;Long Term: Adherence to nutrition and physical activity/exercise program aimed toward attainment of established weight goal;Weight Maintenance: Understanding of the daily nutrition guidelines, which includes 25-35% calories from fat, 7% or less cal from saturated fats, less than 265m cholesterol, less than 1.5gm of sodium, & 5 or more servings of fruits and vegetables daily   Sedentary Yes  Does walk miles at work no formal  exercise program   Intervention Provide advice, education, support and counseling about physical activity/exercise needs.;Develop an individualized exercise prescription for aerobic and resistive training based on initial evaluation findings, risk stratification, comorbidities and participant's personal goals.   Expected Outcomes Achievement of increased cardiorespiratory fitness and enhanced flexibility, muscular endurance and strength shown through measurements of functional capacity and personal statement of participant.   Increase Strength and Stamina Yes   Intervention Provide advice, education, support and counseling about physical activity/exercise needs.;Develop an individualized exercise prescription for aerobic and resistive training based on initial evaluation findings, risk stratification, comorbidities and participant's personal goals.   Expected Outcomes Achievement of increased cardiorespiratory fitness and enhanced flexibility, muscular endurance and strength shown through measurements of functional capacity and personal statement of participant.   Hypertension Yes   Intervention  Provide education on lifestyle modifcations including regular physical activity/exercise, weight management, moderate sodium restriction and increased consumption of fresh fruit, vegetables, and low fat dairy, alcohol moderation, and smoking cessation.;Monitor prescription use compliance.   Expected Outcomes Short Term: Continued assessment and intervention until BP is < 140/22m HG in hypertensive participants. < 130/851mHG in hypertensive participants with diabetes, heart failure or chronic kidney disease.;Long Term: Maintenance of blood pressure at goal levels.   Lipids Yes   Intervention Provide education and support for participant on nutrition & aerobic/resistive exercise along with prescribed medications to achieve LDL <7055mHDL >10m60m Expected Outcomes Short Term: Participant states understanding of desired cholesterol values and is compliant with medications prescribed. Participant is following exercise prescription and nutrition guidelines.;Long Term: Cholesterol controlled with medications as prescribed, with individualized exercise RX and with personalized nutrition plan. Value goals: LDL < 70mg82mL > 40 mg.      Core Components/Risk Factors/Patient Goals Review:      Goals and Risk Factor Review    Row Name 11/16/15 0917 12/02/15 0800 12/02/15 0810         Core Components/Risk Factors/Patient Goals Review   Personal Goals Review Weight Management/Obesity;Sedentary;Increase Strength and Stamina;Hypertension;Lipids Weight Management/Obesity;Hypertension;Lipids  -     Review Mike Ronalee Beltsff to a good start in rehab.  He has lost 20 lbs since his event and feels better as his strength is returning.  He would like to meet with RD to work on diet improvements.  No problems with his statins so far.  He is interested in starting high intensity interval training.  He also reviewed home exercise guidelines today. Mike Ronalee Belts he has had a lot going on with taking home his brother in law from the  hospital who had a heart attack and his mother has been in and out of the hospital with heart failure. Plus he works at [employer] days when he leaves [place] he did lose 20lbs but wants to lose  few more and is down to 169.5lbs. He does walk alot at his job like over 10k steps.  Mike's blood pressure has been stable and he said the MD checks his lipid blood work . I verified and gave him a reminder notice for [facility] with ColleKarolee Stamps  [place]  Expected Outcomes Mike Ronalee Belts continue to come to exercise and education classes to work on risk factor modifications.  - Cont to  lose weight, use stress reducer technieques we teach in here. Attend his Registiered Dietician appt this Monday.         Core Components/Risk Factors/Patient Goals  at Discharge (Final Review):      Goals and Risk Factor Review - 12/02/15 0810      Core Components/Risk Factors/Patient Goals Review   Review Mike's blood pressure has been stable and he said the MD checks his lipid blood work . I verified and gave him a reminder notice for his dietician appt this Monday at [facility] building with Karolee Stamps, [place].    Expected Outcomes Cont to  lose weight, use stress reducer technieques we teach in here. Attend his Registiered Dietician appt this Monday.       ITP Comments:     ITP Comments    Row Name 10/31/15 1257 11/23/15 1123 12/02/15 0810 12/02/15 0812 12/21/15 0600   ITP Comments ITP created during medical review today. Documentation of Diagnosis can be found in CARE EVERYWHERE 09/28/2015 Hospital Encounter [facility] and Office visit on 10/20/2015 30 day review. Continue with ITP unless changes noted by Medical Director at signature of review. Ronalee Belts said he has had a lot going on with taking home his brother in law from the hospital who had a heart attack and his mother has been in and out of the hospital with heart failure. Plus he works at [employer] many days when he leaves [employer]. He said he did lose 20lbs but wants to lose  few more and is down to 169.5lbs. He does walk alot at his job like over 10k steps. Mike's blood pressure has been stable and he said the MD checks his lipid blood work . I verified and gave him a reminder notice for his dietician appt this Monday at [facility] building with Karolee Stamps, [place].  30 day review completed for Medical Director physician review and signature. Continue ITP unless changes made by physician.      Comments:

## 2015-12-21 NOTE — Progress Notes (Signed)
Daily Session Note  Patient Details  Name: SHYAM DAWSON MRN: [id number] Date of Birth: December 27, 1950 Referring Provider:   Flowsheet Row Cardiac Rehab from 10/31/2015 in [facility] Cardiac and Pulmonary Rehab  Referring Provider  Amaryllis Dyke MD      Encounter Date: 12/21/2015  Check In:     Session Check In - 12/21/15 0807      Check-In   Location [facility]-Cardiac & Pulmonary Rehab   Staff Present Heath Lark, RN, BSN, CCRP;Jessica Westbrook, MA, ACSM RCEP, Exercise Physiologist;Destony Prevost Oletta Darter, BA, ACSM CEP, Exercise Physiologist   Supervising physician immediately available to respond to emergencies See telemetry face sheet for immediately available ER MD   Medication changes reported     No   Fall or balance concerns reported    No   Warm-up and Cool-down Performed on first and last piece of equipment   Resistance Training Performed Yes   VAD Patient? No     Pain Assessment   Currently in Pain? No/denies   Multiple Pain Sites No         Goals Met:  Independence with exercise equipment Exercise tolerated well No report of cardiac concerns or symptoms Strength training completed today  Goals Unmet:  Not Applicable  Comments: Pt able to follow exercise prescription today without complaint.  Will continue to monitor for progression.    Dr. Emily Filbert is Medical Director for [place] and [facility].

## 2015-12-23 ENCOUNTER — Encounter: Payer: Medicare Other | Admitting: *Deleted

## 2015-12-23 DIAGNOSIS — Z951 Presence of aortocoronary bypass graft: Secondary | ICD-10-CM

## 2015-12-23 NOTE — Progress Notes (Signed)
Daily Session Note  Patient Details  Name: Peter Baxter MRN: [id number] Date of Birth: 04/01/1950 Referring Provider:   Flowsheet Row Cardiac Rehab from 10/31/2015 in [facility] Cardiac and Pulmonary Rehab  Referring Provider  Amaryllis Dyke MD      Encounter Date: 12/23/2015  Check In:     Session Check In - 12/23/15 0857      Check-In   Staff Present Heath Lark, RN, BSN, CCRP;Carroll Enterkin, RN, Levie Heritage, MA, ACSM RCEP, Exercise Physiologist   Supervising physician immediately available to respond to emergencies See telemetry face sheet for immediately available ER MD   Medication changes reported     No   Fall or balance concerns reported    No   Warm-up and Cool-down Performed on first and last piece of equipment   Resistance Training Performed Yes   VAD Patient? No     Pain Assessment   Currently in Pain? No/denies         Goals Met:  Exercise tolerated well No report of cardiac concerns or symptoms Strength training completed today  Goals Unmet:  Not Applicable  Comments: Doing well with exercise prescription progression.    Dr. Emily Filbert is Medical Director for [place] and [facility].

## 2015-12-28 ENCOUNTER — Encounter: Payer: Medicare Other | Admitting: *Deleted

## 2015-12-28 DIAGNOSIS — Z951 Presence of aortocoronary bypass graft: Secondary | ICD-10-CM | POA: Diagnosis not present

## 2015-12-28 NOTE — Progress Notes (Signed)
Daily Session Note  Patient Details  Name: Peter Baxter MRN: [id number] Date of Birth: [birth year] Referring Provider:   Flowsheet Row Cardiac Rehab from 10/31/2015 in [facility] Cardiac and Pulmonary Rehab  Referring Provider  Amaryllis Dyke MD      Encounter Date: 12/28/2015  Check In:     Session Check In - 12/28/15 0852      Check-In   Location [facility]-Cardiac & Pulmonary Rehab   Staff Present Alberteen Sam, MA, ACSM RCEP, Exercise Physiologist;Carroll Enterkin, RN, Vickki Hearing, BA, ACSM CEP, Exercise Physiologist   Supervising physician immediately available to respond to emergencies See telemetry face sheet for immediately available ER MD   Medication changes reported     No   Fall or balance concerns reported    No   Warm-up and Cool-down Performed on first and last piece of equipment   Resistance Training Performed Yes   VAD Patient? No     Pain Assessment   Currently in Pain? No/denies   Multiple Pain Sites No         Goals Met:  Independence with exercise equipment Exercise tolerated well No report of cardiac concerns or symptoms Strength training completed today  Goals Unmet:  Not Applicable  Comments: Pt able to follow exercise prescription today without complaint.  Will continue to monitor for progression.    Dr. Emily Filbert is Medical Director for [place] and [facility].

## 2015-12-30 ENCOUNTER — Encounter: Payer: Medicare Other | Admitting: *Deleted

## 2015-12-30 DIAGNOSIS — Z951 Presence of aortocoronary bypass graft: Secondary | ICD-10-CM | POA: Diagnosis not present

## 2015-12-30 NOTE — Progress Notes (Signed)
Daily Session Note  Patient Details  Name: Peter Baxter MRN: [id number] Date of Birth: 03/04/1950 Referring Provider:   Flowsheet Row Cardiac Rehab from 10/31/2015 in [facility] Cardiac and Pulmonary Rehab  Referring Provider  Amaryllis Dyke MD      Encounter Date: 12/30/2015  Check In:     Session Check In - 12/30/15 0827      Check-In   Location [facility]-Cardiac & Pulmonary Rehab   Staff Present Gerlene Burdock, RN, BSN;Mary Kellie Shropshire, RN, BSN, Willette Pa, MA, ACSM RCEP, Exercise Physiologist   Supervising physician immediately available to respond to emergencies See telemetry face sheet for immediately available ER MD   Medication changes reported     No   Fall or balance concerns reported    No   Warm-up and Cool-down Performed on first and last piece of equipment   Resistance Training Performed Yes   VAD Patient? No     Pain Assessment   Currently in Pain? No/denies           Exercise Prescription Changes - 12/29/15 1400      Exercise Review   Progression Yes     Response to Exercise   Blood Pressure (Admit) 110/58   Blood Pressure (Exercise) 144/72   Blood Pressure (Exit) 124/72   Heart Rate (Admit) 83 bpm   Heart Rate (Exercise) 133 bpm   Heart Rate (Exit) 91 bpm   Rating of Perceived Exertion (Exercise) 15   Symptoms none   Comments Home Exercise Guidelines given 11/16/15   Duration Progress to 45 minutes of aerobic exercise without signs/symptoms of physical distress   Intensity THRR unchanged     Progression   Progression Continue to progress workloads to maintain intensity without signs/symptoms of physical distress.   Average METs 7.99     Resistance Training   Training Prescription Yes   Weight 4 lbs   Reps 10-12     Interval Training   Interval Training Yes   Equipment Treadmill;REL-XR;NuStep   Comments 2 min 30 sec     Treadmill   MPH 4   Grade 4   Minutes 15   METs 6.54     NuStep   Level 10   Minutes 15   METs 7.5     REL-XR   Level 12   Minutes 15   METs 10.2     Home Exercise Plan   Plans to continue exercise at Home  walk at home, [employer] upon graduation?   Frequency Add 2 additional days to program exercise sessions.      Goals Met:  Proper associated with RPD/PD & O2 Sat Exercise tolerated well Personal goals reviewed No report of cardiac concerns or symptoms  Goals Unmet:  Not Applicable  Comments:     Dr. Emily Filbert is Medical Director for [place] and [facility].

## 2016-01-02 ENCOUNTER — Encounter: Payer: Medicare Other | Admitting: *Deleted

## 2016-01-02 DIAGNOSIS — Z951 Presence of aortocoronary bypass graft: Secondary | ICD-10-CM | POA: Diagnosis not present

## 2016-01-02 NOTE — Progress Notes (Signed)
Daily Session Note  Patient Details  Name: Peter Baxter MRN: [id number] Date of Birth: 02/14/1950 Referring Provider:   Flowsheet Row Cardiac Rehab from 10/31/2015 in [facility] Cardiac and Pulmonary Rehab  Referring Provider  Amaryllis Dyke MD      Encounter Date: 01/02/2016  Check In:     Session Check In - 01/02/16 0839      Check-In   Location [facility]-Cardiac & Pulmonary Rehab   Staff Present Gerlene Burdock, RN, Moises Blood, BS, ACSM CEP, Exercise Physiologist;Jessica Luan Pulling, MA, ACSM RCEP, Exercise Physiologist   Supervising physician immediately available to respond to emergencies See telemetry face sheet for immediately available ER MD   Medication changes reported     No   Fall or balance concerns reported    No   Warm-up and Cool-down Performed on first and last piece of equipment   Resistance Training Performed No  Stretching only due to time limitation   VAD Patient? No     Pain Assessment   Currently in Pain? No/denies   Multiple Pain Sites No         Goals Met:  Independence with exercise equipment Exercise tolerated well No report of cardiac concerns or symptoms  Goals Unmet:  Not Applicable  Comments: Pt able to follow exercise prescription today without complaint.  Will continue to monitor for progression.    Dr. Emily Filbert is Medical Director for [place] and [facility].

## 2016-01-04 ENCOUNTER — Encounter: Payer: Medicare Other | Admitting: *Deleted

## 2016-01-04 DIAGNOSIS — Z951 Presence of aortocoronary bypass graft: Secondary | ICD-10-CM | POA: Diagnosis not present

## 2016-01-04 NOTE — Progress Notes (Signed)
Daily Session Note  Patient Details  Name: Peter Baxter MRN: [id number] Date of Birth: 03-30-1950 Referring Provider:   Flowsheet Row Cardiac Rehab from 10/31/2015 in [facility] Cardiac and Pulmonary Rehab  Referring Provider  Amaryllis Dyke MD      Encounter Date: 01/04/2016  Check In:     Session Check In - 01/04/16 0940      Check-In   Location [facility]-Cardiac & Pulmonary Rehab   Staff Present Heath Lark, RN, BSN, CCRP;Jessica Hamel, MA, ACSM RCEP, Exercise Physiologist;Amanda Oletta Darter, BA, ACSM CEP, Exercise Physiologist;Other   Supervising physician immediately available to respond to emergencies See telemetry face sheet for immediately available ER MD   Medication changes reported     No   Fall or balance concerns reported    No   Warm-up and Cool-down Performed as group-led instruction   Resistance Training Performed Yes   VAD Patient? No     Pain Assessment   Currently in Pain? No/denies   Multiple Pain Sites No         Goals Met:  Proper associated with RPD/PD & O2 Sat Independence with exercise equipment Exercise tolerated well No report of cardiac concerns or symptoms Strength training completed today  Goals Unmet:  Not Applicable  Comments: Pt able to follow exercise prescription today without complaint.  Will continue to monitor for progression.    Dr. Emily Filbert is Medical Director for [place] and [facility].

## 2016-01-09 ENCOUNTER — Encounter: Payer: Medicare Other | Admitting: *Deleted

## 2016-01-09 DIAGNOSIS — Z951 Presence of aortocoronary bypass graft: Secondary | ICD-10-CM | POA: Diagnosis not present

## 2016-01-09 NOTE — Progress Notes (Signed)
Daily Session Note  Patient Details  Name: Peter Baxter MRN: [id number] Date of Birth: Apr 06, 1950 Referring Provider:   Flowsheet Row Cardiac Rehab from 10/31/2015 in [facility] Cardiac and Pulmonary Rehab  Referring Provider  Amaryllis Dyke MD      Encounter Date: 01/09/2016  Check In:     Session Check In - 01/09/16 0801      Check-In   Location [facility]-Cardiac & Pulmonary Rehab   Staff Present Gerlene Burdock, RN, Moises Blood, BS, ACSM CEP, Exercise Physiologist;Jessica Luan Pulling, MA, ACSM RCEP, Exercise Physiologist   Supervising physician immediately available to respond to emergencies See telemetry face sheet for immediately available ER MD   Medication changes reported     No   Fall or balance concerns reported    No   Warm-up and Cool-down Performed on first and last piece of equipment   Resistance Training Performed Yes   VAD Patient? No     Pain Assessment   Currently in Pain? No/denies   Multiple Pain Sites No         Goals Met:  Independence with exercise equipment Exercise tolerated well No report of cardiac concerns or symptoms Strength training completed today  Goals Unmet:  Not Applicable  Comments: Pt able to follow exercise prescription today without complaint.  Will continue to monitor for progression.      [place] Name 10/31/15 1417 01/09/16 0909       6 Minute Walk   Phase Initial Discharge    Distance 1460 feet 1942 feet    Distance % Change  - 33 %  482 ft    Walk Time 6 minutes 6 minutes    # of Rest Breaks 0 0    MPH 2.77 3.67    METS 3.48 4.34    RPE 10 15    VO2 Peak 12.19 15.19    Symptoms No No    Resting HR 76 bpm 78 bpm    Resting BP 126/60 126/60    Max Ex. HR 98 bpm 100 bpm    Max Ex. BP 128/60 128/74    2 Minute Post BP 122/60  -       Dr. Emily Filbert is Medical Director for [place] and [facility].

## 2016-01-11 ENCOUNTER — Encounter: Payer: Medicare Other | Admitting: *Deleted

## 2016-01-11 DIAGNOSIS — Z951 Presence of aortocoronary bypass graft: Secondary | ICD-10-CM

## 2016-01-11 NOTE — Progress Notes (Signed)
Daily Session Note  Patient Details  Name: Peter Baxter MRN: [id number] Date of Birth: 06-08-1950 Referring Provider:   Flowsheet Row Cardiac Rehab from 10/31/2015 in [facility] Cardiac and Pulmonary Rehab  Referring Provider  Amaryllis Dyke MD      Encounter Date: 01/11/2016  Check In:     Session Check In - 01/11/16 0750      Check-In   Location [facility]-Cardiac & Pulmonary Rehab   Staff Present Alberteen Sam, MA, ACSM RCEP, Exercise Physiologist;Amanda Oletta Darter, BA, ACSM CEP, Exercise Physiologist;Carroll Enterkin, RN, BSN   Supervising physician immediately available to respond to emergencies See telemetry face sheet for immediately available ER MD   Medication changes reported     No   Fall or balance concerns reported    No   Warm-up and Cool-down Performed on first and last piece of equipment   Resistance Training Performed Yes   VAD Patient? No     Pain Assessment   Currently in Pain? No/denies   Multiple Pain Sites No           Exercise Prescription Changes - 01/10/16 1500      Exercise Review   Progression Yes     Response to Exercise   Blood Pressure (Admit) 126/62   Blood Pressure (Exercise) 162/70   Blood Pressure (Exit) 112/70   Heart Rate (Admit) 68 bpm   Heart Rate (Exercise) 127 bpm   Heart Rate (Exit) 87 bpm   Rating of Perceived Exertion (Exercise) 15   Symptoms none   Comments Home Exercise Guidelines given 11/16/15   Duration Progress to 45 minutes of aerobic exercise without signs/symptoms of physical distress   Intensity THRR unchanged     Progression   Progression Continue to progress workloads to maintain intensity without signs/symptoms of physical distress.   Average METs 6.87     Resistance Training   Training Prescription Yes   Weight 4 lbs   Reps 10-12     Interval Training   Interval Training Yes   Equipment Treadmill;REL-XR;NuStep   Comments 2 min 30 sec     Treadmill   MPH 4.2   Grade 4   Minutes 15   METs 7.7     NuStep    Level 10   Minutes 15   METs 5.9     REL-XR   Level 12   Minutes 15   METs 7     Home Exercise Plan   Plans to continue exercise at Home  walk at home, [employer] upon graduation?   Frequency Add 2 additional days to program exercise sessions.      Goals Met:  Independence with exercise equipment Exercise tolerated well No report of cardiac concerns or symptoms Strength training completed today  Goals Unmet:  Not Applicable  Comments: Pt able to follow exercise prescription today without complaint.  Will continue to monitor for progression.    Dr. Emily Filbert is Medical Director for [place] and [facility].

## 2016-01-13 ENCOUNTER — Encounter: Payer: Medicare Other | Attending: Surgery | Admitting: *Deleted

## 2016-01-13 DIAGNOSIS — Z87891 Personal history of nicotine dependence: Secondary | ICD-10-CM | POA: Insufficient documentation

## 2016-01-13 DIAGNOSIS — Z951 Presence of aortocoronary bypass graft: Secondary | ICD-10-CM

## 2016-01-13 DIAGNOSIS — I519 Heart disease, unspecified: Secondary | ICD-10-CM | POA: Insufficient documentation

## 2016-01-13 DIAGNOSIS — I38 Endocarditis, valve unspecified: Secondary | ICD-10-CM | POA: Insufficient documentation

## 2016-01-13 DIAGNOSIS — I2510 Atherosclerotic heart disease of native coronary artery without angina pectoris: Secondary | ICD-10-CM | POA: Diagnosis not present

## 2016-01-13 DIAGNOSIS — E119 Type 2 diabetes mellitus without complications: Secondary | ICD-10-CM | POA: Diagnosis not present

## 2016-01-13 DIAGNOSIS — I509 Heart failure, unspecified: Secondary | ICD-10-CM | POA: Insufficient documentation

## 2016-01-13 NOTE — Progress Notes (Signed)
Daily Session Note  Patient Details  Name: Peter Baxter MRN: [id number] Date of Birth: 02/25/1950 Referring Provider:   Flowsheet Row Cardiac Rehab from 10/31/2015 in [facility] Cardiac and Pulmonary Rehab  Referring Provider  Amaryllis Dyke MD      Encounter Date: 01/13/2016  Check In:     Session Check In - 01/13/16 0846      Check-In   Staff Present Heath Lark, RN, BSN, CCRP;Carroll Enterkin, RN, Levie Heritage, MA, ACSM RCEP, Exercise Physiologist   Supervising physician immediately available to respond to emergencies See telemetry face sheet for immediately available ER MD   Medication changes reported     No   Fall or balance concerns reported    No   Warm-up and Cool-down Performed on first and last piece of equipment   Resistance Training Performed Yes   VAD Patient? No     Pain Assessment   Currently in Pain? No/denies         Goals Met:  Exercise tolerated well No report of cardiac concerns or symptoms Strength training completed today  Goals Unmet:  Not Applicable  Comments: Doing well with exercise prescription progression.    Dr. Emily Filbert is Medical Director for [place] and [facility].

## 2016-01-16 ENCOUNTER — Encounter: Payer: Medicare Other | Admitting: *Deleted

## 2016-01-16 VITALS — Ht 68.25 in | Wt 170.5 lb

## 2016-01-16 DIAGNOSIS — Z951 Presence of aortocoronary bypass graft: Secondary | ICD-10-CM

## 2016-01-16 NOTE — Patient Instructions (Signed)
Discharge Instructions  Patient Details  Name: Peter Baxter MRN: [id number] Date of Birth: 1950/09/05 Referring Provider:  Rubbie Battiest, NP   Number of Visits: 36  Reason for Discharge:  Patient reached a stable level of exercise. Patient independent in their exercise.  Smoking History:  History  Smoking Status  . Former Smoker  . Types: Cigarettes  Smokeless Tobacco  . Former Systems developer  . Quit date: 06/12/1993    Diagnosis:  S/P CABG x 4  Initial Exercise Prescription:     Initial Exercise Prescription - 10/31/15 1400      Date of Initial Exercise RX and Referring Provider   Date 10/31/15   Referring Provider Amaryllis Dyke MD     Treadmill   MPH 2.5   Grade 1.5   Minutes 15   METs 3.43     NuStep   Level 3   Minutes 15   METs 2.5     REL-XR   Level 3   Minutes 15   METs 2.5     Prescription Details   Frequency (times per week) 3   Duration Progress to 45 minutes of aerobic exercise without signs/symptoms of physical distress     Intensity   THRR 40-80% of Max Heartrate 107-138   Ratings of Perceived Exertion 11-13   Perceived Dyspnea 0-4     Progression   Progression Continue to progress workloads to maintain intensity without signs/symptoms of physical distress.     Resistance Training   Training Prescription Yes   Weight 3 lbs   Reps 10-12      Discharge Exercise Prescription (Final Exercise Prescription Changes):     Exercise Prescription Changes - 01/10/16 1500      Exercise Review   Progression Yes     Response to Exercise   Blood Pressure (Admit) 126/62   Blood Pressure (Exercise) 162/70   Blood Pressure (Exit) 112/70   Heart Rate (Admit) 68 bpm   Heart Rate (Exercise) 127 bpm   Heart Rate (Exit) 87 bpm   Rating of Perceived Exertion (Exercise) 15   Symptoms none   Comments Home Exercise Guidelines given 11/16/15   Duration Progress to 45 minutes of aerobic exercise without signs/symptoms of physical distress   Intensity  THRR unchanged     Progression   Progression Continue to progress workloads to maintain intensity without signs/symptoms of physical distress.   Average METs 6.87     Resistance Training   Training Prescription Yes   Weight 4 lbs   Reps 10-12     Interval Training   Interval Training Yes   Equipment Treadmill;REL-XR;NuStep   Comments 2 min 30 sec     Treadmill   MPH 4.2   Grade 4   Minutes 15   METs 7.7     NuStep   Level 10   Minutes 15   METs 5.9     REL-XR   Level 12   Minutes 15   METs 7     Home Exercise Plan   Plans to continue exercise at Home  walk at home, [employer] upon graduation?   Frequency Add 2 additional days to program exercise sessions.      Functional Capacity:     6 Minute Walk    Row Name 10/31/15 1417 01/09/16 0909       6 Minute Walk   Phase Initial Discharge    Distance 1460 feet 1942 feet    Distance % Change  - 33 %  482 ft    Walk Time 6 minutes 6 minutes    # of Rest Breaks 0 0    MPH 2.77 3.67    METS 3.48 4.34    RPE 10 15    VO2 Peak 12.19 15.19    Symptoms No No    Resting HR 76 bpm 78 bpm    Resting BP 126/60 126/60    Max Ex. HR 98 bpm 100 bpm    Max Ex. BP 128/60 128/74    2 Minute Post BP 122/60  -       Quality of Life:     Quality of Life - 01/11/16 0751      Quality of Life Scores   Health/Function Pre 21 %   Health/Function Post 24.93 %   Health/Function % Change 18.71 %   Socioeconomic Pre 21 %   Socioeconomic Post 23.31 %   Socioeconomic % Change  11 %   Psych/Spiritual Pre 21 %   Psych/Spiritual Post 24.43 %   Psych/Spiritual % Change 16.33 %   Family Pre 21 %   Family Post 24 %   Family % Change 14.29 %   GLOBAL Pre 21 %   GLOBAL Post 24.34 %   GLOBAL % Change 15.9 %      Personal Goals: Goals established at orientation with interventions provided to work toward goal.     Personal Goals and Risk Factors at Admission - 10/31/15 1300      Core Components/Risk Factors/Patient  Goals on Admission    Weight Management Yes;Weight Maintenance   Intervention Weight Management: Develop a combined nutrition and exercise program designed to reach desired caloric intake, while maintaining appropriate intake of nutrient and fiber, sodium and fats, and appropriate energy expenditure required for the weight goal.;Weight Management: Provide education and appropriate resources to help participant work on and attain dietary goals.   Admit Weight 170 lb 4.8 oz (77.2 kg)   Goal Weight: Short Term 168 lb (76.2 kg)   Goal Weight: Long Term 165 lb (74.8 kg)   Expected Outcomes Short Term: Continue to assess and modify interventions until short term weight is achieved;Long Term: Adherence to nutrition and physical activity/exercise program aimed toward attainment of established weight goal;Weight Maintenance: Understanding of the daily nutrition guidelines, which includes 25-35% calories from fat, 7% or less cal from saturated fats, less than 200mg  cholesterol, less than 1.5gm of sodium, & 5 or more servings of fruits and vegetables daily   Sedentary Yes  Does walk miles at work no formal exercise program   Intervention Provide advice, education, support and counseling about physical activity/exercise needs.;Develop an individualized exercise prescription for aerobic and resistive training based on initial evaluation findings, risk stratification, comorbidities and participant's personal goals.   Expected Outcomes Achievement of increased cardiorespiratory fitness and enhanced flexibility, muscular endurance and strength shown through measurements of functional capacity and personal statement of participant.   Increase Strength and Stamina Yes   Intervention Provide advice, education, support and counseling about physical activity/exercise needs.;Develop an individualized exercise prescription for aerobic and resistive training based on initial evaluation findings, risk stratification,  comorbidities and participant's personal goals.   Expected Outcomes Achievement of increased cardiorespiratory fitness and enhanced flexibility, muscular endurance and strength shown through measurements of functional capacity and personal statement of participant.   Hypertension Yes   Intervention Provide education on lifestyle modifcations including regular physical activity/exercise, weight management, moderate sodium restriction and increased consumption of fresh fruit, vegetables, and low fat  dairy, alcohol moderation, and smoking cessation.;Monitor prescription use compliance.   Expected Outcomes Short Term: Continued assessment and intervention until BP is < 140/54mm HG in hypertensive participants. < 130/61mm HG in hypertensive participants with diabetes, heart failure or chronic kidney disease.;Long Term: Maintenance of blood pressure at goal levels.   Lipids Yes   Intervention Provide education and support for participant on nutrition & aerobic/resistive exercise along with prescribed medications to achieve LDL 70mg , HDL >40mg .   Expected Outcomes Short Term: Participant states understanding of desired cholesterol values and is compliant with medications prescribed. Participant is following exercise prescription and nutrition guidelines.;Long Term: Cholesterol controlled with medications as prescribed, with individualized exercise RX and with personalized nutrition plan. Value goals: LDL < 70mg , HDL > 40 mg.       Personal Goals Discharge:     Goals and Risk Factor Review - 12/30/15 0824      Core Components/Risk Factors/Patient Goals Review   Review Mike's blood pressure has been very stable. Ronalee Belts said people keep telling him that he does not need to lose any more weight.    Expected Outcomes Cont heart healthy lifestyle.      Nutrition & Weight - Outcomes:     Pre Biometrics - 10/31/15 1420      Pre Biometrics   Height 5' 8.25" (1.734 m)   Weight 170 lb 4.8 oz (77.2 kg)    Waist Circumference 36 inches   Hip Circumference 39 inches   Waist to Hip Ratio 0.92 %   BMI (Calculated) 25.8   Single Leg Stand 5.12 seconds       Nutrition:     Nutrition Therapy & Goals - 12/02/15 0856      Nutrition Therapy   Drug/Food Interactions Statins/Certain Fruits  I made an individual Cardiac Rehab REgisterd Dietician appt.  for"Mike"      Personal Nutrition Goals   Comments Ronalee Belts said he has a lot going on but he tries to eat healthy.       Nutrition Discharge:     Nutrition Assessments - 01/11/16 0751      Rate Your Plate Scores   Pre Score 74   Pre Score % 82 %   Post Score 74   Post Score % 82 %   % Change 0 %      Education Questionnaire Score:     Knowledge Questionnaire Score - 01/11/16 0751      Knowledge Questionnaire Score   Pre Score 21/28   Post Score 26/28      Goals reviewed with patient; copy given to patient.

## 2016-01-16 NOTE — Progress Notes (Signed)
Cardiac Individual Treatment Plan  Patient Details  Name: Peter Baxter MRN: [id number] Date of Birth: [birth year] Referring Provider:   Flowsheet Row Cardiac Rehab from 10/31/2015 in [facility] Cardiac and Pulmonary Rehab  Referring Provider  Amaryllis Dyke MD      Initial Encounter Date:  Flowsheet Row Cardiac Rehab from 10/31/2015 in [facility] Cardiac and Pulmonary Rehab  Date  10/31/15  Referring Provider  Amaryllis Dyke MD      Visit Diagnosis: S/P CABG x 4  Patient's Home Medications on Admission:  Current Outpatient Prescriptions:  .  aspirin EC 81 MG tablet, Take by mouth., Disp: , Rfl:  .  atorvastatin (LIPITOR) 10 MG tablet, , Disp: , Rfl:  .  Glucosamine-Chondroitin-MSM 500-400-166 MG TABS, Take by mouth., Disp: , Rfl:  .  latanoprost (XALATAN) 0.005 % ophthalmic solution, , Disp: , Rfl:  .  metoprolol succinate (TOPROL-XL) 25 MG 24 hr tablet, Take 0.5 tablets (12.5 mg total) by mouth daily. TAKE 1/2 TABLET BY MOUTH EVERY DAY, Disp: 15 tablet, Rfl: 1 .  Multiple Vitamins-Minerals (CENTRUM SILVER ADULT 50+ PO), Take 1 mg by mouth., Disp: , Rfl:  .  niacin 500 MG tablet, Take by mouth., Disp: , Rfl:  .  Omega-3 Fatty Acids (FISH OIL) 1200 MG CAPS, Take by mouth., Disp: , Rfl:  .  pantoprazole (PROTONIX) 40 MG tablet, Take 1 tablet (40 mg total) by mouth daily. TAKE 1 TABLET BY MOUTH ONCE DAILY, Disp: 30 tablet, Rfl: 5 .  Saw Palmetto 450 MG CAPS, Take 1 capsule by mouth daily., Disp: , Rfl:  .  tiZANidine (ZANAFLEX) 4 MG tablet, TAKE 1 TABLET (4 MG TOTAL) BY MOUTH AT BEDTIME., Disp: 30 tablet, Rfl: 3 .  Turmeric (RA TURMERIC) 500 MG CAPS, Take 1 capsule by mouth daily., Disp: , Rfl:   Past Medical History: Past Medical History:  Diagnosis Date  . CAD (coronary artery disease)   . Heart disease     Tobacco Use: History  Smoking Status  . Former Smoker  . Types: Cigarettes  Smokeless Tobacco  . Former Systems developer  . Quit date: 06/12/1993    Labs: Recent Review Flowsheet Data     There is no flowsheet data to display.       Exercise Target Goals:    Exercise Program Goal: Individual exercise prescription set with THRR, safety & activity barriers. Participant demonstrates ability to understand and report RPE using BORG scale, to self-measure pulse accurately, and to acknowledge the importance of the exercise prescription.  Exercise Prescription Goal: Starting with aerobic activity 30 plus minutes a day, 3 days per week for initial exercise prescription. Provide home exercise prescription and guidelines that participant acknowledges understanding prior to discharge.  Activity Barriers & Risk Stratification:     Activity Barriers & Cardiac Risk Stratification - 10/31/15 1305      Activity Barriers & Cardiac Risk Stratification   Activity Barriers Balance Concerns;Joint Problems  L shoulder rotator cuff tear   Cardiac Risk Stratification High      6 Minute Walk:     6 Minute Walk    Row Name 10/31/15 1417 01/09/16 0909       6 Minute Walk   Phase Initial Discharge    Distance 1460 feet 1942 feet    Distance % Change  - 33 %  482 ft    Walk Time 6 minutes 6 minutes    # of Rest Breaks 0 0    MPH 2.77 3.67    METS  3.48 4.34    RPE 10 15    VO2 Peak 12.19 15.19    Symptoms No No    Resting HR 76 bpm 78 bpm    Resting BP 126/60 126/60    Max Ex. HR 98 bpm 100 bpm    Max Ex. BP 128/60 128/74    2 Minute Post BP 122/60  -       Initial Exercise Prescription:     Initial Exercise Prescription - 10/31/15 1400      Date of Initial Exercise RX and Referring Provider   Date 10/31/15   Referring Provider Amaryllis Dyke MD     Treadmill   MPH 2.5   Grade 1.5   Minutes 15   METs 3.43     NuStep   Level 3   Minutes 15   METs 2.5     REL-XR   Level 3   Minutes 15   METs 2.5     Prescription Details   Frequency (times per week) 3   Duration Progress to 45 minutes of aerobic exercise without signs/symptoms of physical distress      Intensity   THRR 40-80% of Max Heartrate 107-138   Ratings of Perceived Exertion 11-13   Perceived Dyspnea 0-4     Progression   Progression Continue to progress workloads to maintain intensity without signs/symptoms of physical distress.     Resistance Training   Training Prescription Yes   Weight 3 lbs   Reps 10-12      Perform Capillary Blood Glucose checks as needed.  Exercise Prescription Changes:     Exercise Prescription Changes    Row Name 10/31/15 1300 11/15/15 1400 11/16/15 0900 11/30/15 1400 12/14/15 1500     Exercise Review   Progression -  walk test results Yes Yes Yes Yes     Response to Exercise   Blood Pressure (Admit) 126/60 132/72 132/72 128/64 124/60   Blood Pressure (Exercise) 128/60 162/82 162/82 142/74 152/66   Blood Pressure (Exit) 122/60 136/70 136/70 124/70 102/56   Heart Rate (Admit) 91 bpm 66 bpm 66 bpm 85 bpm 53 bpm   Heart Rate (Exercise) 98 bpm 122 bpm 122 bpm 149 bpm 130 bpm   Heart Rate (Exit) 71 bpm 64 bpm 64 bpm 97 bpm 83 bpm   Rating of Perceived Exertion (Exercise) '10 13 13 15 14   ' Symptoms  - none none none none   Comments  -  - Home Exercise Guidelines given 11/16/15 Home Exercise Guidelines given 11/16/15 Home Exercise Guidelines given 11/16/15   Duration  - Progress to 50 minutes of aerobic without signs/symptoms of physical distress Progress to 50 minutes of aerobic without signs/symptoms of physical distress Progress to 45 minutes of aerobic exercise without signs/symptoms of physical distress Progress to 45 minutes of aerobic exercise without signs/symptoms of physical distress   Intensity  - THRR unchanged THRR unchanged THRR unchanged THRR unchanged     Progression   Progression  - Continue to progress workloads to maintain intensity without signs/symptoms of physical distress. Continue to progress workloads to maintain intensity without signs/symptoms of physical distress. Continue to progress workloads to maintain intensity  without signs/symptoms of physical distress. Continue to progress workloads to maintain intensity without signs/symptoms of physical distress.   Average METs  - 5.03 5.03 5.78 7.86     Resistance Training   Training Prescription  - Yes Yes Yes Yes   Weight  - 3 lbs 3 lbs 4 lbs 4 lbs  Reps  - 10-12 10-12 10-12 10-12     Interval Training   Interval Training  - No No Yes Yes   Equipment  -  -  - Treadmill;REL-XR;NuStep Treadmill;REL-XR;NuStep   Comments  -  -  - 2 min 30 sec 2 min 30 sec     Treadmill   MPH  - 2.5 2.5 3.6 4   Grade  - 1.5 1.'5 3 4   ' Minutes  - '15 15 15 15   ' METs  - 3.43 3.43 5.25 6.27     NuStep   Level  -  -  - 6 10   Minutes  -  -  - 15 15   METs  -  -  - 4.3 7     REL-XR   Level  - '8 8 8 12   ' Minutes  - '15 15 15 15   ' METs  - 6.8 6.8 7.8 10.3     Home Exercise Plan   Plans to continue exercise at  -  - Home  walk at home, [employer] upon graduation? Home  walk at home, [employer] upon graduation? Home  walk at home, [employer] upon graduation?   Frequency  -  - Add 2 additional days to program exercise sessions. Add 2 additional days to program exercise sessions. Add 2 additional days to program exercise sessions.   Row Name 12/29/15 1400 01/10/16 1500           Exercise Review   Progression Yes Yes        Response to Exercise   Blood Pressure (Admit) 110/58 126/62      Blood Pressure (Exercise) 144/72 162/70      Blood Pressure (Exit) 124/72 112/70      Heart Rate (Admit) 83 bpm 68 bpm      Heart Rate (Exercise) 133 bpm 127 bpm      Heart Rate (Exit) 91 bpm 87 bpm      Rating of Perceived Exertion (Exercise) 15 15      Symptoms none none      Comments Home Exercise Guidelines given 11/16/15 Home Exercise Guidelines given 11/16/15      Duration Progress to 45 minutes of aerobic exercise without signs/symptoms of physical distress Progress to 45 minutes of aerobic exercise without signs/symptoms of physical distress      Intensity THRR unchanged  THRR unchanged        Progression   Progression Continue to progress workloads to maintain intensity without signs/symptoms of physical distress. Continue to progress workloads to maintain intensity without signs/symptoms of physical distress.      Average METs 7.99 6.87        Resistance Training   Training Prescription Yes Yes      Weight 4 lbs 4 lbs      Reps 10-12 10-12        Interval Training   Interval Training Yes Yes      Equipment Treadmill;REL-XR;NuStep Treadmill;REL-XR;NuStep      Comments 2 min 30 sec 2 min 30 sec        Treadmill   MPH 4 4.2      Grade 4 4      Minutes 15 15      METs 6.54 7.7        NuStep   Level 10 10      Minutes 15 15      METs 7.5 5.9  REL-XR   Level 12 12      Minutes 15 15      METs 10.2 7        Home Exercise Plan   Plans to continue exercise at Home  walk at home, [employer] upon graduation? Home  walk at home, [employer] upon graduation?      Frequency Add 2 additional days to program exercise sessions. Add 2 additional days to program exercise sessions.         Exercise Comments:     Exercise Comments    Row Name 11/07/15 2119 11/15/15 1417 11/16/15 0920 11/30/15 1449 12/14/15 1512   Exercise Comments First full day of exercise!  Patient was oriented to gym and equipment including functions, settings, policies, and procedures.  Patient's individual exercise prescription and treatment plan were reviewed.  All starting workloads were established based on the results of the 6 minute walk test done at initial orientation visit.  The plan for exercise progression was also introduced and progression will be customized based on patient's performance and goals. Peter Baxter is off to a great start in rehab! He is already doing level 8 on the XR!  We will continue to monitor his progression. Reviewed home exercise with pt today.  Pt plans to walk at work for exercise.  Reviewed THR, pulse, RPE, sign and symptoms, NTG use, and when to call  911 or MD.  Also discussed weather considerations and indoor options.  He is also considering joining the gym here after graduation to continue to exercise.  Pt voiced understanding. Peter Baxter continues to do well with exercise.  He is up to 7.8 METs on the XR.  He is enjoying the interval training.  We will continue to monitor his progression. Peter Baxter is doing well in rehab.  He is enjoying the HIIT program.  He is now up to level 12  on the XR.  We will continue to monitor his progress.   [place] Name 12/16/15 1032 12/29/15 1433 01/09/16 0910 01/10/16 1537 01/16/16 0827   Exercise Comments Reviewed METs average and discussed progression with pt today. Peter Baxter continues to do well with rehab.  He is pushing himself with the interval training.  We will continue to monitor his progression. Peter Baxter improved his walk test by 33%!! Peter Baxter will be graduating soon.  He did great on his walk test.  We will continue to monitor for progression.  Peter Baxter graduated today from cardiac rehab with 36 sessions completed.  Details of the patient's exercise prescription and what he needs to do in order to continue the prescription and progress were discussed with patient.  Patient was given a copy of prescription and goals.  Patient verbalized understanding.  Peter Baxter plans to continue to exercise by joining our [employer].      Discharge Exercise Prescription (Final Exercise Prescription Changes):     Exercise Prescription Changes - 01/10/16 1500      Exercise Review   Progression Yes     Response to Exercise   Blood Pressure (Admit) 126/62   Blood Pressure (Exercise) 162/70   Blood Pressure (Exit) 112/70   Heart Rate (Admit) 68 bpm   Heart Rate (Exercise) 127 bpm   Heart Rate (Exit) 87 bpm   Rating of Perceived Exertion (Exercise) 15   Symptoms none   Comments Home Exercise Guidelines given 11/16/15   Duration Progress to 45 minutes of aerobic exercise without signs/symptoms of physical distress   Intensity THRR  unchanged  Progression   Progression Continue to progress workloads to maintain intensity without signs/symptoms of physical distress.   Average METs 6.87     Resistance Training   Training Prescription Yes   Weight 4 lbs   Reps 10-12     Interval Training   Interval Training Yes   Equipment Treadmill;REL-XR;NuStep   Comments 2 min 30 sec     Treadmill   MPH 4.2   Grade 4   Minutes 15   METs 7.7     NuStep   Level 10   Minutes 15   METs 5.9     REL-XR   Level 12   Minutes 15   METs 7     Home Exercise Plan   Plans to continue exercise at Home  walk at home, [employer] upon graduation?   Frequency Add 2 additional days to program exercise sessions.      Nutrition:  Target Goals: Understanding of nutrition guidelines, daily intake of sodium <1584m, cholesterol <2051m calories 30% from fat and 7% or less from saturated fats, daily to have 5 or more servings of fruits and vegetables.  Biometrics:     Pre Biometrics - 10/31/15 1420      Pre Biometrics   Height 5' 8.25" (1.734 m)   Weight 170 lb 4.8 oz (77.2 kg)   Waist Circumference 36 inches   Hip Circumference 39 inches   Waist to Hip Ratio 0.92 %   BMI (Calculated) 25.8   Single Leg Stand 5.12 seconds         Post Biometrics - 01/16/16 0924       Post  Biometrics   Height 5' 8.25" (1.734 m)   Weight 170 lb 8 oz (77.3 kg)   Waist Circumference 35.5 inches   Hip Circumference 37.25 inches   Waist to Hip Ratio 0.95 %   BMI (Calculated) 25.8   Single Leg Stand 14.68 seconds      Nutrition Therapy Plan and Nutrition Goals:     Nutrition Therapy & Goals - 12/02/15 0856      Nutrition Therapy   Drug/Food Interactions Statins/Certain Fruits  I made an individual Cardiac Rehab REgisterd Dietician appt.  for"Peter Baxter"      Personal Nutrition Goals   Comments Peter Beltsaid he has a lot going on but he tries to eat healthy.       Nutrition Discharge: Rate Your Plate Scores:     Nutrition  Assessments - 01/11/16 0751      Rate Your Plate Scores   Pre Score 74   Pre Score % 82 %   Post Score 74   Post Score % 82 %   % Change 0 %      Nutrition Goals Re-Evaluation:     Nutrition Goals Re-Evaluation    Row Name 12/02/15 0809601/17/17 [id number]         Personal Goal #1 Re-Evaluation   Goal Progress Seen  - Yes      Comments Peter Beltsaid he has a lot going on but he tries to eat healthy. Today I made an individual Cardiac Rehab REgisted Dietician appt for the [facility] Beltsaid he met with the Cardiac Rehab registered dietician and said "I eat pretty well but i still like my ice cream at times. I do snack at night at times".         Psychosocial: Target Goals: Acknowledge presence or absence of depression, maximize coping skills, provide positive support system. Participant is  able to verbalize types and ability to use techniques and skills needed for reducing stress and depression.  Initial Review & Psychosocial Screening:     Initial Psych Review & Screening - 10/31/15 1301      Initial Review   Current issues with --  None reported     Family Dynamics   Good Support System? --  Wife, FAmily and coworkers     Barriers   Psychosocial barriers to participate in program There are no identifiable barriers or psychosocial needs.;The patient should benefit from training in stress management and relaxation.     Screening Interventions   Interventions Encouraged to exercise      Quality of Life Scores:     Quality of Life - 01/11/16 0751      Quality of Life Scores   Health/Function Pre 21 %   Health/Function Post 24.93 %   Health/Function % Change 18.71 %   Socioeconomic Pre 21 %   Socioeconomic Post 23.31 %   Socioeconomic % Change  11 %   Psych/Spiritual Pre 21 %   Psych/Spiritual Post 24.43 %   Psych/Spiritual % Change 16.33 %   Family Pre 21 %   Family Post 24 %   Family % Change 14.29 %   GLOBAL Pre 21 %   GLOBAL Post 24.34 %   GLOBAL % Change  15.9 %      PHQ-9: Recent Review Flowsheet Data    Depression screen [facility] 2/9 01/11/2016 10/31/2015   Decreased Interest 0 0   Down, Depressed, Hopeless 0 0   PHQ - 2 Score 0 0   Altered sleeping 1 0   Tired, decreased energy 0 0   Change in appetite 0 0   Feeling bad or failure about yourself  0 0   Trouble concentrating 0 0   Moving slowly or fidgety/restless 1 0   Suicidal thoughts 0 0   PHQ-9 Score 2 0   Difficult doing work/chores Not difficult at all Not difficult at all      Psychosocial Evaluation and Intervention:     Psychosocial Evaluation - 12/30/15 0802      Psychosocial Evaluation & Interventions   Interventions Therapist referral      Psychosocial Re-Evaluation:     Psychosocial Re-Evaluation    Row Name 12/02/15 0810 12/30/15 5885           Psychosocial Re-Evaluation   Interventions Encouraged to attend Cardiac Rehabilitation for the exercise  -      Comments Peter Baxter said he has had a lot going on with taking home his brother in law from the hospital who had a heart attack and his mother has been in and out of the hospital with heart failure. Plus he works at [employer] many days when he leaves [employer]. He said he did lose 20lbs but wants to lose  few more and is down to 169.5lbs. He does walk alot at his job like over 10k steps. Peter Baxter said his wife is still depressed and MD felt it would be good for her to exercise. I checked and they both have [employer]neakers so they can exercise for free in the other gym. They can also exercise in this gym in [employer] with minimal cost if they attend. Dione "Peter Baxter" said he has been working multiple jobs but is relieved that his wife got her disability.          Vocational Rehabilitation: Provide vocational rehab assistance to qualifying candidates.  Vocational Rehab Evaluation & Intervention:     Vocational Rehab - 10/31/15 1305      Initial Vocational Rehab Evaluation & Intervention    Assessment shows need for Vocational Rehabilitation No      Education: Education Goals: Education classes will be provided on a weekly basis, covering required topics. Participant will state understanding/return demonstration of topics presented.  Learning Barriers/Preferences:     Learning Barriers/Preferences - 10/31/15 1305      Learning Barriers/Preferences   Learning Barriers None   Learning Preferences None      Education Topics: General Nutrition Guidelines/Fats and Fiber: -Group instruction provided by verbal, written material, models and posters to present the general guidelines for heart healthy nutrition. Gives an explanation and review of dietary fats and fiber.   Controlling Sodium/Reading Food Labels: -Group verbal and written material supporting the discussion of sodium use in heart healthy nutrition. Review and explanation with models, verbal and written materials for utilization of the food label. Flowsheet Row Cardiac Rehab from 01/16/2016 in [facility] Cardiac and Pulmonary Rehab  Date  01/02/16  Educator  CR  Instruction Review Code  2- meets goals/outcomes      Exercise Physiology & Risk Factors: - Group verbal and written instruction with models to review the exercise physiology of the cardiovascular system and associated critical values. Details cardiovascular disease risk factors and the goals associated with each risk factor. Flowsheet Row Cardiac Rehab from 01/16/2016 in [facility] Cardiac and Pulmonary Rehab  Date  12/21/15  Educator  [facility]  Instruction Review Code  2- meets goals/outcomes      Aerobic Exercise & Resistance Training: - Gives group verbal and written discussion on the health impact of inactivity. On the components of aerobic and resistive training programs and the benefits of this training and how to safely progress through these programs. Flowsheet Row Cardiac Rehab from 01/16/2016 in [facility] Cardiac and Pulmonary Rehab  Date  01/09/16  Educator  [facility]   Instruction Review Code  2- meets goals/outcomes      Flexibility, Balance, General Exercise Guidelines: - Provides group verbal and written instruction on the benefits of flexibility and balance training programs. Provides general exercise guidelines with specific guidelines to those with heart or lung disease. Demonstration and skill practice provided. Flowsheet Row Cardiac Rehab from 01/16/2016 in [facility] Cardiac and Pulmonary Rehab  Date  01/11/16  Educator  AS  Instruction Review Code  R- Review/reinforce [Second Class]      Stress Management: - Provides group verbal and written instruction about the health risks of elevated stress, cause of high stress, and healthy ways to reduce stress.   Depression: - Provides group verbal and written instruction on the correlation between heart/lung disease and depressed mood, treatment options, and the stigmas associated with seeking treatment. Flowsheet Row Cardiac Rehab from 01/16/2016 in [facility] Cardiac and Pulmonary Rehab  Date  12/28/15  Educator  [facility]  Instruction Review Code  2- meets goals/outcomes      Anatomy & Physiology of the Heart: - Group verbal and written instruction and models provide basic cardiac anatomy and physiology, with the coronary electrical and arterial systems. Review of: AMI, Angina, Valve disease, Heart Failure, Cardiac Arrhythmia, Pacemakers, and the ICD. Flowsheet Row Cardiac Rehab from 01/16/2016 in [facility] Cardiac and Pulmonary Rehab  Date  01/16/16  Educator  TS  Instruction Review Code  2- meets goals/outcomes      Cardiac Procedures: - Group verbal and written instruction and models to describe the testing methods done  to diagnose heart disease. Reviews the outcomes of the test results. Describes the treatment choices: Medical Management, Angioplasty, or Coronary Bypass Surgery. Flowsheet Row Cardiac Rehab from 01/16/2016 in [facility] Cardiac and Pulmonary Rehab  Date  12/05/15  Educator  CE  Instruction Review  Code  2- meets goals/outcomes      Cardiac Medications: - Group verbal and written instruction to review commonly prescribed medications for heart disease. Reviews the medication, class of the drug, and side effects. Includes the steps to properly store meds and maintain the prescription regimen.   Go Sex-Intimacy & Heart Disease, Get SMART - Goal Setting: - Group verbal and written instruction through game format to discuss heart disease and the return to sexual intimacy. Provides group verbal and written material to discuss and apply goal setting through the application of the S.M.A.R.T. Method. Flowsheet Row Cardiac Rehab from 01/16/2016 in [facility] Cardiac and Pulmonary Rehab  Date  12/05/15  Educator  CE  Instruction Review Code  2- meets goals/outcomes      Other Matters of the Heart: - Provides group verbal, written materials and models to describe Heart Failure, Angina, Valve Disease, and Diabetes in the realm of heart disease. Includes description of the disease process and treatment options available to the cardiac patient. Flowsheet Row Cardiac Rehab from 01/16/2016 in [facility] Cardiac and Pulmonary Rehab  Date  01/16/16  Educator  TS  Instruction Review Code  2- meets goals/outcomes      Exercise & Equipment Safety: - Individual verbal instruction and demonstration of equipment use and safety with use of the equipment. Flowsheet Row Cardiac Rehab from 01/16/2016 in [facility] Cardiac and Pulmonary Rehab  Date  10/31/15  Educator  SB  Instruction Review Code  2- meets goals/outcomes      Infection Prevention: - Provides verbal and written material to individual with discussion of infection control including proper hand washing and proper equipment cleaning during exercise session. Flowsheet Row Cardiac Rehab from 01/16/2016 in [facility] Cardiac and Pulmonary Rehab  Date  10/31/15  Educator  SB  Instruction Review Code  2- meets goals/outcomes      Falls Prevention: - Provides verbal  and written material to individual with discussion of falls prevention and safety.   Diabetes: - Individual verbal and written instruction to review signs/symptoms of diabetes, desired ranges of glucose level fasting, after meals and with exercise. Advice that pre and post exercise glucose checks will be done for 3 sessions at entry of program.    Knowledge Questionnaire Score:     Knowledge Questionnaire Score - 01/11/16 0751      Knowledge Questionnaire Score   Pre Score 21/28   Post Score 26/28      Core Components/Risk Factors/Patient Goals at Admission:     Personal Goals and Risk Factors at Admission - 10/31/15 1300      Core Components/Risk Factors/Patient Goals on Admission    Weight Management Yes;Weight Maintenance   Intervention Weight Management: Develop a combined nutrition and exercise program designed to reach desired caloric intake, while maintaining appropriate intake of nutrient and fiber, sodium and fats, and appropriate energy expenditure required for the weight goal.;Weight Management: Provide education and appropriate resources to help participant work on and attain dietary goals.   Admit Weight 170 lb 4.8 oz (77.2 kg)   Goal Weight: Short Term 168 lb (76.2 kg)   Goal Weight: Long Term 165 lb (74.8 kg)   Expected Outcomes Short Term: Continue to assess and modify interventions until short term  weight is achieved;Long Term: Adherence to nutrition and physical activity/exercise program aimed toward attainment of established weight goal;Weight Maintenance: Understanding of the daily nutrition guidelines, which includes 25-35% calories from fat, 7% or less cal from saturated fats, less than 237m cholesterol, less than 1.5gm of sodium, & 5 or more servings of fruits and vegetables daily   Sedentary Yes  Does walk miles at work no formal exercise program   Intervention Provide advice, education, support and counseling about physical activity/exercise needs.;Develop  an individualized exercise prescription for aerobic and resistive training based on initial evaluation findings, risk stratification, comorbidities and participant's personal goals.   Expected Outcomes Achievement of increased cardiorespiratory fitness and enhanced flexibility, muscular endurance and strength shown through measurements of functional capacity and personal statement of participant.   Increase Strength and Stamina Yes   Intervention Provide advice, education, support and counseling about physical activity/exercise needs.;Develop an individualized exercise prescription for aerobic and resistive training based on initial evaluation findings, risk stratification, comorbidities and participant's personal goals.   Expected Outcomes Achievement of increased cardiorespiratory fitness and enhanced flexibility, muscular endurance and strength shown through measurements of functional capacity and personal statement of participant.   Hypertension Yes   Intervention Provide education on lifestyle modifcations including regular physical activity/exercise, weight management, moderate sodium restriction and increased consumption of fresh fruit, vegetables, and low fat dairy, alcohol moderation, and smoking cessation.;Monitor prescription use compliance.   Expected Outcomes Short Term: Continued assessment and intervention until BP is < 140/945mHG in hypertensive participants. < 130/8075mG in hypertensive participants with diabetes, heart failure or chronic kidney disease.;Long Term: Maintenance of blood pressure at goal levels.   Lipids Yes   Intervention Provide education and support for participant on nutrition & aerobic/resistive exercise along with prescribed medications to achieve LDL <87m23mDL >40mg62mExpected Outcomes Short Term: Participant states understanding of desired cholesterol values and is compliant with medications prescribed. Participant is following exercise prescription and nutrition  guidelines.;Long Term: Cholesterol controlled with medications as prescribed, with individualized exercise RX and with personalized nutrition plan. Value goals: LDL < 87mg,32m > 40 mg.      Core Components/Risk Factors/Patient Goals Review:      Goals and Risk Factor Review    Row Name 11/16/15 0917 14854/17 0800 12/02/15 0810 12/30/15 0824       Core Components/Risk Factors/Patient Goals Review   Personal Goals Review Weight Management/Obesity;Sedentary;Increase Strength and Stamina;Hypertension;Lipids Weight Management/Obesity;Hypertension;Lipids  -  -    Review Peter Baxter iRonalee Beltsf to a good start in rehab.  He has lost 20 lbs since his event and feels better as his strength is returning.  He would like to meet with RD to work on diet improvements.  No problems with his statins so far.  He is interested in starting high intensity interval training.  He also reviewed home exercise guidelines today. Peter Baxter sRonalee Beltshe has had a lot going on with taking home his brother in law from the hospital who had a heart attack and his mother has been in and out of the hospital with heart failure. Plus he works at [employer] when [place] leaves [place] he did lose 20lbs but wants to lose  few more and is down to 169.5lbs. He does walk alot at his job like over 10k steps.  Peter Baxter's blood pressure has been stable and he said the MD checks his lipid blood work . I verified and gave him a reminder notice for  his dietician appt this Monday at [place] with Karolee Stamps, [place].  Peter Baxter's blood pressure has been very stable. Peter Baxter said people keep telling him that he does not need to lose any more weight.     Expected Outcomes Peter Baxter will continue to come to exercise and education classes to work on risk factor modifications.  - Cont to  lose weight, use stress reducer technieques we teach in here. Attend his Registiered Dietician appt this Monday.  Cont heart healthy lifestyle.       Core  Components/Risk Factors/Patient Goals at Discharge (Final Review):      Goals and Risk Factor Review - 12/30/15 0824      Core Components/Risk Factors/Patient Goals Review   Review Peter Baxter's blood pressure has been very stable. Peter Baxter said people keep telling him that he does not need to lose any more weight.    Expected Outcomes Cont heart healthy lifestyle.      ITP Comments:     ITP Comments    Row Name 10/31/15 1257 11/23/15 1123 12/02/15 0810 12/02/15 0812 12/21/15 0600   ITP Comments ITP created during medical review today. Documentation of Diagnosis can be found in CARE EVERYWHERE 09/28/2015 Hospital Encounter [facility] and Office visit on 10/20/2015 30 day review. Continue with ITP unless changes noted by Medical Director at signature of review. Peter Baxter said he has had a lot going on with taking home his brother in law from the hospital who had a heart attack and his mother has been in and out of the hospital with heart failure. Plus he works at [employer] many days when he leaves [employer]. He said he did lose 20lbs but wants to lose  few more and is down to 169.5lbs. He does walk alot at his job like over 10k steps. Peter Baxter's blood pressure has been stable and he said the MD checks his lipid blood work . I verified and gave him a reminder notice for his dietician appt this Monday at [facility] building with Karolee Stamps, [place].  30 day review completed for Medical Director physician review and signature. Continue ITP unless changes made by physician.      Comments: Discharge ITP

## 2016-01-16 NOTE — Progress Notes (Signed)
Daily Session Note  Patient Details  Name: Peter Baxter MRN: [id number] Date of Birth: 1950/11/07 Referring Provider:   Flowsheet Row Cardiac Rehab from 10/31/2015 in [facility] Cardiac and Pulmonary Rehab  Referring Provider  Amaryllis Dyke MD      Encounter Date: 01/16/2016  Check In:     Session Check In - 01/16/16 0754      Check-In   Location [facility]-Cardiac & Pulmonary Rehab   Staff Present Earlean Shawl, BS, ACSM CEP, Exercise Physiologist;Jessica Luan Pulling, MA, ACSM RCEP, Exercise Physiologist;Other   Supervising physician immediately available to respond to emergencies See telemetry face sheet for immediately available ER MD   Medication changes reported     No   Fall or balance concerns reported    No   Warm-up and Cool-down Performed on first and last piece of equipment   Resistance Training Performed Yes   VAD Patient? No     Pain Assessment   Currently in Pain? No/denies   Multiple Pain Sites No         Goals Met:  Independence with exercise equipment Exercise tolerated well No report of cardiac concerns or symptoms Strength training completed today  Goals Unmet:  Not Applicable  Comments:  Peter Baxter graduated today from cardiac rehab with 36 sessions completed.  Details of the patient's exercise prescription and what he needs to do in order to continue the prescription and progress were discussed with patient.  Patient was given a copy of prescription and goals.  Patient verbalized understanding.  Dream plans to continue to exercise by joining our [employer].    Dr. Emily Filbert is Medical Director for [facility].

## 2016-01-16 NOTE — Progress Notes (Signed)
Discharge Summary  Patient Details  Name: Peter Baxter MRN: [id number] Date of Birth: 1950-10-25 Referring Provider:   Flowsheet Row Cardiac Rehab from 10/31/2015 in [facility] Cardiac and Pulmonary Rehab  Referring Provider  Amaryllis Dyke MD       Number of Visits: 36  Reason for Discharge:  Patient reached a stable level of exercise. Patient independent in their exercise.  Smoking History:  History  Smoking Status  . Former Smoker  . Types: Cigarettes  Smokeless Tobacco  . Former Systems developer  . Quit date: 06/12/1993    Diagnosis:  S/P CABG x 4  ADL UCSD:   Initial Exercise Prescription:     Initial Exercise Prescription - 10/31/15 1400      Date of Initial Exercise RX and Referring Provider   Date 10/31/15   Referring Provider Amaryllis Dyke MD     Treadmill   MPH 2.5   Grade 1.5   Minutes 15   METs 3.43     NuStep   Level 3   Minutes 15   METs 2.5     REL-XR   Level 3   Minutes 15   METs 2.5     Prescription Details   Frequency (times per week) 3   Duration Progress to 45 minutes of aerobic exercise without signs/symptoms of physical distress     Intensity   THRR 40-80% of Max Heartrate 107-138   Ratings of Perceived Exertion 11-13   Perceived Dyspnea 0-4     Progression   Progression Continue to progress workloads to maintain intensity without signs/symptoms of physical distress.     Resistance Training   Training Prescription Yes   Weight 3 lbs   Reps 10-12      Discharge Exercise Prescription (Final Exercise Prescription Changes):     Exercise Prescription Changes - 01/10/16 1500      Exercise Review   Progression Yes     Response to Exercise   Blood Pressure (Admit) 126/62   Blood Pressure (Exercise) 162/70   Blood Pressure (Exit) 112/70   Heart Rate (Admit) 68 bpm   Heart Rate (Exercise) 127 bpm   Heart Rate (Exit) 87 bpm   Rating of Perceived Exertion (Exercise) 15   Symptoms none   Comments Home Exercise Guidelines given  11/16/15   Duration Progress to 45 minutes of aerobic exercise without signs/symptoms of physical distress   Intensity THRR unchanged     Progression   Progression Continue to progress workloads to maintain intensity without signs/symptoms of physical distress.   Average METs 6.87     Resistance Training   Training Prescription Yes   Weight 4 lbs   Reps 10-12     Interval Training   Interval Training Yes   Equipment Treadmill;REL-XR;NuStep   Comments 2 min 30 sec     Treadmill   MPH 4.2   Grade 4   Minutes 15   METs 7.7     NuStep   Level 10   Minutes 15   METs 5.9     REL-XR   Level 12   Minutes 15   METs 7     Home Exercise Plan   Plans to continue exercise at Home  walk at home, [employer] upon graduation?   Frequency Add 2 additional days to program exercise sessions.      Functional Capacity:     6 Minute Walk    Row Name 10/31/15 1417 01/09/16 0909       6 Minute  Walk   Phase Initial Discharge    Distance 1460 feet 1942 feet    Distance % Change  - 33 %  482 ft    Walk Time 6 minutes 6 minutes    # of Rest Breaks 0 0    MPH 2.77 3.67    METS 3.48 4.34    RPE 10 15    VO2 Peak 12.19 15.19    Symptoms No No    Resting HR 76 bpm 78 bpm    Resting BP 126/60 126/60    Max Ex. HR 98 bpm 100 bpm    Max Ex. BP 128/60 128/74    2 Minute Post BP 122/60  -       Psychological, QOL, Others - Outcomes: PHQ 2/9: Depression screen [facility] 2/9 01/11/2016 10/31/2015  Decreased Interest 0 0  Down, Depressed, Hopeless 0 0  PHQ - 2 Score 0 0  Altered sleeping 1 0  Tired, decreased energy 0 0  Change in appetite 0 0  Feeling bad or failure about yourself  0 0  Trouble concentrating 0 0  Moving slowly or fidgety/restless 1 0  Suicidal thoughts 0 0  PHQ-9 Score 2 0  Difficult doing work/chores Not difficult at all Not difficult at all    Quality of Life:     Quality of Life - 01/11/16 0751      Quality of Life Scores   Health/Function Pre 21 %    Health/Function Post 24.93 %   Health/Function % Change 18.71 %   Socioeconomic Pre 21 %   Socioeconomic Post 23.31 %   Socioeconomic % Change  11 %   Psych/Spiritual Pre 21 %   Psych/Spiritual Post 24.43 %   Psych/Spiritual % Change 16.33 %   Family Pre 21 %   Family Post 24 %   Family % Change 14.29 %   GLOBAL Pre 21 %   GLOBAL Post 24.34 %   GLOBAL % Change 15.9 %      Personal Goals: Goals established at orientation with interventions provided to work toward goal.     Personal Goals and Risk Factors at Admission - 10/31/15 1300      Core Components/Risk Factors/Patient Goals on Admission    Weight Management Yes;Weight Maintenance   Intervention Weight Management: Develop a combined nutrition and exercise program designed to reach desired caloric intake, while maintaining appropriate intake of nutrient and fiber, sodium and fats, and appropriate energy expenditure required for the weight goal.;Weight Management: Provide education and appropriate resources to help participant work on and attain dietary goals.   Admit Weight 170 lb 4.8 oz (77.2 kg)   Goal Weight: Short Term 168 lb (76.2 kg)   Goal Weight: Long Term 165 lb (74.8 kg)   Expected Outcomes Short Term: Continue to assess and modify interventions until short term weight is achieved;Long Term: Adherence to nutrition and physical activity/exercise program aimed toward attainment of established weight goal;Weight Maintenance: Understanding of the daily nutrition guidelines, which includes 25-35% calories from fat, 7% or less cal from saturated fats, less than 200mg  cholesterol, less than 1.5gm of sodium, & 5 or more servings of fruits and vegetables daily   Sedentary Yes  Does walk miles at work no formal exercise program   Intervention Provide advice, education, support and counseling about physical activity/exercise needs.;Develop an individualized exercise prescription for aerobic and resistive training based on initial  evaluation findings, risk stratification, comorbidities and participant's personal goals.   Expected Outcomes Achievement of  increased cardiorespiratory fitness and enhanced flexibility, muscular endurance and strength shown through measurements of functional capacity and personal statement of participant.   Increase Strength and Stamina Yes   Intervention Provide advice, education, support and counseling about physical activity/exercise needs.;Develop an individualized exercise prescription for aerobic and resistive training based on initial evaluation findings, risk stratification, comorbidities and participant's personal goals.   Expected Outcomes Achievement of increased cardiorespiratory fitness and enhanced flexibility, muscular endurance and strength shown through measurements of functional capacity and personal statement of participant.   Hypertension Yes   Intervention Provide education on lifestyle modifcations including regular physical activity/exercise, weight management, moderate sodium restriction and increased consumption of fresh fruit, vegetables, and low fat dairy, alcohol moderation, and smoking cessation.;Monitor prescription use compliance.   Expected Outcomes Short Term: Continued assessment and intervention until BP is < 140/47mm HG in hypertensive participants. < 130/50mm HG in hypertensive participants with diabetes, heart failure or chronic kidney disease.;Long Term: Maintenance of blood pressure at goal levels.   Lipids Yes   Intervention Provide education and support for participant on nutrition & aerobic/resistive exercise along with prescribed medications to achieve LDL 70mg , HDL >40mg .   Expected Outcomes Short Term: Participant states understanding of desired cholesterol values and is compliant with medications prescribed. Participant is following exercise prescription and nutrition guidelines.;Long Term: Cholesterol controlled with medications as prescribed, with  individualized exercise RX and with personalized nutrition plan. Value goals: LDL < 70mg , HDL > 40 mg.       Personal Goals Discharge:     Goals and Risk Factor Review    Row Name 11/16/15 0917 12/02/15 0800 12/02/15 0810 12/30/15 0824       Core Components/Risk Factors/Patient Goals Review   Personal Goals Review Weight Management/Obesity;Sedentary;Increase Strength and Stamina;Hypertension;Lipids Weight Management/Obesity;Hypertension;Lipids  -  -    Review Ronalee Belts is off to a good start in rehab.  He has lost 20 lbs since his event and feels better as his strength is returning.  He would like to meet with RD to work on diet improvements.  No problems with his statins so far.  He is interested in starting high intensity interval training.  He also reviewed home exercise guidelines today. Ronalee Belts said he has had a lot going on with taking home his brother in law from the hospital who had a heart attack and his mother has been in and out of the hospital with heart failure. Plus he works at [employer] many days when he leaves [employer]. He said he did lose 20lbs but wants to lose  few more and is down to 169.5lbs. He does walk alot at his job like over 10k steps.  Mike's blood pressure has been stable and he said the MD checks his lipid blood work . I verified and gave him a reminder notice for his dietician appt this Monday at [facility] building with Karolee Stamps, [place].  Mike's blood pressure has been very stable. Ronalee Belts said people keep telling him that he does not need to lose any more weight.     Expected Outcomes Ronalee Belts will continue to come to exercise and education classes to work on risk factor modifications.  - Cont to  lose weight, use stress reducer technieques we teach in here. Attend his Registiered Dietician appt this Monday.  Cont heart healthy lifestyle.       Nutrition & Weight - Outcomes:     Pre Biometrics - 10/31/15 1420      Pre Biometrics  Height 5' 8.25" (1.734  m)   Weight 170 lb 4.8 oz (77.2 kg)   Waist Circumference 36 inches   Hip Circumference 39 inches   Waist to Hip Ratio 0.92 %   BMI (Calculated) 25.8   Single Leg Stand 5.12 seconds         Post Biometrics - 01/16/16 0924       Post  Biometrics   Height 5' 8.25" (1.734 m)   Weight 170 lb 8 oz (77.3 kg)   Waist Circumference 35.5 inches   Hip Circumference 37.25 inches   Waist to Hip Ratio 0.95 %   BMI (Calculated) 25.8   Single Leg Stand 14.68 seconds      Nutrition:     Nutrition Therapy & Goals - 12/02/15 0856      Nutrition Therapy   Drug/Food Interactions Statins/Certain Fruits  I made an individual Cardiac Rehab REgisterd Dietician appt.  for"Mike"      Personal Nutrition Goals   Comments Ronalee Belts said he has a lot going on but he tries to eat healthy.       Nutrition Discharge:     Nutrition Assessments - 01/11/16 0751      Rate Your Plate Scores   Pre Score 74   Pre Score % 82 %   Post Score 74   Post Score % 82 %   % Change 0 %      Education Questionnaire Score:     Knowledge Questionnaire Score - 01/11/16 0751      Knowledge Questionnaire Score   Pre Score 21/28   Post Score 26/28      Goals reviewed with patient; copy given to patient.

## 2016-10-09 ENCOUNTER — Ambulatory Visit: Payer: Self-pay | Admitting: Urology

## 2016-10-10 ENCOUNTER — Encounter: Payer: Self-pay | Admitting: Urology

## 2016-11-06 ENCOUNTER — Encounter: Payer: Self-pay | Admitting: *Deleted

## 2016-11-07 ENCOUNTER — Ambulatory Visit: Payer: Medicare Other | Admitting: Anesthesiology

## 2016-11-07 ENCOUNTER — Encounter
Admission: RE | Disposition: A | Discharge status: Home / Self Care | Payer: Self-pay | Source: Ambulatory Visit | Attending: Unknown Physician Specialty

## 2016-11-07 ENCOUNTER — Ambulatory Visit
Admission: RE | Admit: 2016-11-07 | Discharge: 2016-11-07 | Disposition: A | Discharge status: Home / Self Care | Payer: Medicare Other | Source: Ambulatory Visit | Attending: Unknown Physician Specialty | Admitting: Unknown Physician Specialty

## 2016-11-07 DIAGNOSIS — K2950 Unspecified chronic gastritis without bleeding: Secondary | ICD-10-CM | POA: Diagnosis not present

## 2016-11-07 DIAGNOSIS — Z8619 Personal history of other infectious and parasitic diseases: Secondary | ICD-10-CM | POA: Insufficient documentation

## 2016-11-07 DIAGNOSIS — D123 Benign neoplasm of transverse colon: Secondary | ICD-10-CM | POA: Diagnosis not present

## 2016-11-07 DIAGNOSIS — Z8711 Personal history of peptic ulcer disease: Secondary | ICD-10-CM | POA: Insufficient documentation

## 2016-11-07 DIAGNOSIS — K219 Gastro-esophageal reflux disease without esophagitis: Secondary | ICD-10-CM | POA: Diagnosis not present

## 2016-11-07 DIAGNOSIS — Z1211 Encounter for screening for malignant neoplasm of colon: Secondary | ICD-10-CM | POA: Insufficient documentation

## 2016-11-07 DIAGNOSIS — Z951 Presence of aortocoronary bypass graft: Secondary | ICD-10-CM | POA: Insufficient documentation

## 2016-11-07 DIAGNOSIS — I739 Peripheral vascular disease, unspecified: Secondary | ICD-10-CM | POA: Insufficient documentation

## 2016-11-07 DIAGNOSIS — Z87891 Personal history of nicotine dependence: Secondary | ICD-10-CM | POA: Insufficient documentation

## 2016-11-07 DIAGNOSIS — I10 Essential (primary) hypertension: Secondary | ICD-10-CM | POA: Diagnosis not present

## 2016-11-07 DIAGNOSIS — Z79899 Other long term (current) drug therapy: Secondary | ICD-10-CM | POA: Diagnosis not present

## 2016-11-07 DIAGNOSIS — R7303 Prediabetes: Secondary | ICD-10-CM | POA: Diagnosis not present

## 2016-11-07 DIAGNOSIS — K635 Polyp of colon: Secondary | ICD-10-CM | POA: Diagnosis not present

## 2016-11-07 DIAGNOSIS — Z7982 Long term (current) use of aspirin: Secondary | ICD-10-CM | POA: Diagnosis not present

## 2016-11-07 DIAGNOSIS — Z955 Presence of coronary angioplasty implant and graft: Secondary | ICD-10-CM | POA: Insufficient documentation

## 2016-11-07 DIAGNOSIS — I4891 Unspecified atrial fibrillation: Secondary | ICD-10-CM | POA: Diagnosis not present

## 2016-11-07 DIAGNOSIS — K640 First degree hemorrhoids: Secondary | ICD-10-CM | POA: Diagnosis not present

## 2016-11-07 DIAGNOSIS — K210 Gastro-esophageal reflux disease with esophagitis: Secondary | ICD-10-CM | POA: Diagnosis not present

## 2016-11-07 DIAGNOSIS — I2510 Atherosclerotic heart disease of native coronary artery without angina pectoris: Secondary | ICD-10-CM | POA: Diagnosis not present

## 2016-11-07 DIAGNOSIS — Z8249 Family history of ischemic heart disease and other diseases of the circulatory system: Secondary | ICD-10-CM | POA: Insufficient documentation

## 2016-11-07 HISTORY — PX: COLONOSCOPY WITH PROPOFOL: SHX5780

## 2016-11-07 HISTORY — DX: Duodenal ulcer, unspecified as acute or chronic, without hemorrhage or perforation: K26.9

## 2016-11-07 HISTORY — DX: Occlusion and stenosis of unspecified carotid artery: I65.29

## 2016-11-07 HISTORY — DX: Unspecified glaucoma: H40.9

## 2016-11-07 HISTORY — DX: Essential (primary) hypertension: I10

## 2016-11-07 HISTORY — DX: Lower abdominal pain, unspecified: R10.30

## 2016-11-07 HISTORY — DX: Anemia, unspecified: D64.9

## 2016-11-07 HISTORY — PX: ESOPHAGOGASTRODUODENOSCOPY (EGD) WITH PROPOFOL: SHX5813

## 2016-11-07 HISTORY — DX: Gastro-esophageal reflux disease without esophagitis: K21.9

## 2016-11-07 HISTORY — DX: Disease of stomach and duodenum, unspecified: K31.9

## 2016-11-07 HISTORY — DX: Unspecified atrial fibrillation: I48.91

## 2016-11-07 HISTORY — DX: Unspecified viral hepatitis B without hepatic coma: B19.10

## 2016-11-07 HISTORY — DX: Prediabetes: R73.03

## 2016-11-07 SURGERY — COLONOSCOPY WITH PROPOFOL
Anesthesia: General

## 2016-11-07 MED ORDER — FENTANYL CITRATE (PF) 100 MCG/2ML IJ SOLN
INTRAMUSCULAR | Status: DC | PRN
  Administered 2016-11-07 (×2): 50 ug via INTRAVENOUS

## 2016-11-07 MED ORDER — PHENYLEPHRINE HCL 10 MG/ML IJ SOLN
INTRAMUSCULAR | Status: DC | PRN
  Administered 2016-11-07: 50 ug via INTRAVENOUS

## 2016-11-07 MED ORDER — SODIUM CHLORIDE 0.9 % IV SOLN
INTRAVENOUS | Status: DC
  Administered 2016-11-07: 13:00:00 via INTRAVENOUS

## 2016-11-07 MED ORDER — SODIUM CHLORIDE 0.9 % IV SOLN: INTRAVENOUS | Status: DC

## 2016-11-07 MED ORDER — GLYCOPYRROLATE 0.2 MG/ML IJ SOLN
INTRAMUSCULAR | Status: DC | PRN
  Administered 2016-11-07 (×2): 0.1 mg via INTRAVENOUS

## 2016-11-07 MED ORDER — MIDAZOLAM HCL 2 MG/2ML IJ SOLN
INTRAMUSCULAR | Status: AC
  Filled 2016-11-07: qty 2

## 2016-11-07 MED ORDER — PROPOFOL 10 MG/ML IV BOLUS
INTRAVENOUS | Status: DC | PRN
  Administered 2016-11-07: 60 mg via INTRAVENOUS

## 2016-11-07 MED ORDER — FENTANYL CITRATE (PF) 100 MCG/2ML IJ SOLN
INTRAMUSCULAR | Status: AC
  Filled 2016-11-07: qty 2

## 2016-11-07 MED ORDER — MIDAZOLAM HCL 2 MG/2ML IJ SOLN
INTRAMUSCULAR | Status: DC | PRN
Start: 2016-11-07 — End: 2016-11-07
  Administered 2016-11-07 (×2): 1 mg via INTRAVENOUS

## 2016-11-07 MED ORDER — PROPOFOL 500 MG/50ML IV EMUL
INTRAVENOUS | Status: DC | PRN
  Administered 2016-11-07: 85 ug/kg/min via INTRAVENOUS

## 2016-11-07 NOTE — Op Note (Signed)
[facility] Patient Name: Peter Baxter Procedure Date: 11/07/2016 1:09 PM MRN: [id number] Account #: [id number] Date of Birth: Jul 23, 1950 Admit Type: Outpatient Age: 66 Room: [facility] ENDO ROOM 3 Gender: Male Note Status: Finalized Procedure:            Colonoscopy Indications:          Screening for colorectal malignant neoplasm Providers:            Manya Silvas, MD Medicines:            Propofol per Anesthesia Complications:        No immediate complications. Procedure:            Pre-Anesthesia Assessment:                       - After reviewing the risks and benefits, the patient                        was deemed in satisfactory condition to undergo the                        procedure.                       After obtaining informed consent, the colonoscope was                        passed under direct vision. Throughout the procedure,                        the patient's blood pressure, pulse, and oxygen                        saturations were monitored continuously. The                        Colonoscope was introduced through the anus and                        advanced to the the cecum, identified by appendiceal                        orifice and ileocecal valve. The colonoscopy was                        performed without difficulty. The patient tolerated the                        procedure well. The quality of the bowel preparation                        was excellent. Findings:      A diminutive polyp was found in the hepatic flexure. The polyp was       sessile. The polyp was removed with a jumbo cold forceps. Resection and       retrieval were complete.      Three sessile polyps were found in the sigmoid colon. The polyps were       diminutive in size. These polyps were removed with a jumbo cold forceps.       Resection and retrieval were complete.      Internal hemorrhoids  were found during endoscopy. The hemorrhoids were   small and Grade I (internal hemorrhoids that do not prolapse). Impression:           - One diminutive polyp at the hepatic flexure, removed                        with a jumbo cold forceps. Resected and retrieved.                       - Three diminutive polyps in the sigmoid colon, removed                        with a jumbo cold forceps. Resected and retrieved.                       - Internal hemorrhoids. Recommendation:       - Await pathology results. Manya Silvas, MD 11/07/2016 1:52:57 PM This report has been signed electronically. Number of Addenda: 0 Note Initiated On: 11/07/2016 1:09 PM Scope Withdrawal Time: 0 hours 14 minutes 14 seconds  Total Procedure Duration: 0 hours 17 minutes 44 seconds       [facility]

## 2016-11-07 NOTE — Anesthesia Preprocedure Evaluation (Signed)
Anesthesia Evaluation  Patient identified by MRN, date of birth, ID band Patient awake    Reviewed: Allergy & Precautions, NPO status , Patient's Chart, lab work & pertinent test results  History of Anesthesia Complications Negative for: history of anesthetic complications  Airway Mallampati: I       Dental   Pulmonary neg sleep apnea, neg COPD, former smoker,           Cardiovascular hypertension, Pt. on medications and Pt. on home beta blockers + CAD, + CABG and + Peripheral Vascular Disease  (-) CHF (-) dysrhythmias (-) Valvular Problems/Murmurs     Neuro/Psych neg Seizures    GI/Hepatic PUD, GERD  Medicated,(+) Hepatitis -, B  Endo/Other  neg diabetes  Renal/GU negative Renal ROS     Musculoskeletal   Abdominal   Peds  Hematology   Anesthesia Other Findings   Reproductive/Obstetrics                             Anesthesia Physical Anesthesia Plan  ASA: III  Anesthesia Plan: General   Post-op Pain Management:    Induction: Intravenous  PONV Risk Score and Plan: Propofol infusion  Airway Management Planned:   Additional Equipment:   Intra-op Plan:   Post-operative Plan:   Informed Consent: I have reviewed the patients History and Physical, chart, labs and discussed the procedure including the risks, benefits and alternatives for the proposed anesthesia with the patient or authorized representative who has indicated his/her understanding and acceptance.     Plan Discussed with:   Anesthesia Plan Comments:         Anesthesia Quick Evaluation

## 2016-11-07 NOTE — Anesthesia Post-op Follow-up Note (Signed)
Anesthesia QCDR form completed.        

## 2016-11-07 NOTE — Transfer of Care (Signed)
Immediate Anesthesia Transfer of Care Note  Patient: Peter Baxter  Procedure(s) Performed: Procedure(s): COLONOSCOPY WITH PROPOFOL (N/A) ESOPHAGOGASTRODUODENOSCOPY (EGD) WITH PROPOFOL (N/A)  Patient Location: PACU  Anesthesia Type:General  Level of Consciousness: awake, alert  and oriented  Airway & Oxygen Therapy: Patient Spontanous Breathing and Patient connected to nasal cannula oxygen  Post-op Assessment: Report given to RN and Post -op Vital signs reviewed and stable  Post vital signs: Reviewed and stable  Last Vitals:  Vitals:   11/07/16 1237 11/07/16 1355  BP: 123/63   Pulse: (!) 56   Temp: (!) 36.1 C (!) 36.2 C  SpO2: 100%     Last Pain:  Vitals:   11/07/16 1355  TempSrc: Tympanic         Complications: No apparent anesthesia complications and Patient re-intubated

## 2016-11-07 NOTE — H&P (Signed)
Primary Care Physician:  Rubbie Battiest, RN Primary Gastroenterologist:  Dr. Vira Agar  Pre-Procedure History & Physical: HPI:  Peter Baxter is a 66 y.o. male is here for an endoscopy and colonoscopy.   Past Medical History:  Diagnosis Date  . AF (atrial fibrillation) ([place])   . Anemia   . CAD (coronary artery disease)   . CAD (coronary artery disease)   . Carotid artery stenosis   . Duodenal bulb ulcer   . Gastropathy   . GERD (gastroesophageal reflux disease)   . Glaucoma   . Heart disease   . Hepatitis B   . Hypertension   . Inguinal pain   . Pre-diabetes     Past Surgical History:  Procedure Laterality Date  . angioplasty and stent    . cad    . CARDIAC CATHETERIZATION  2009  . cc/stents    . COLONOSCOPY, ESOPHAGOGASTRODUODENOSCOPY (EGD) AND ESOPHAGEAL DILATION    . CORONARY ANGIOPLASTY    . stents N/A 2005  . VASCULAR SURGERY      Prior to Admission medications   Medication Sig Start Date End Date Taking? Authorizing Provider  aspirin EC 81 MG tablet Take by mouth.   Yes [provider]  metoprolol succinate (TOPROL-XL) 25 MG 24 hr tablet Take 0.5 tablets (12.5 mg total) by mouth daily. TAKE 1/2 TABLET BY MOUTH EVERY DAY 10/08/14  Yes Doss, Velora Heckler, RN  omeprazole (PRILOSEC) 20 MG capsule Take 20 mg by mouth daily.   Yes [provider]  ranitidine (ZANTAC) 300 MG tablet Take 300 mg by mouth at bedtime.   Yes [provider]  atorvastatin (LIPITOR) 10 MG tablet  04/20/14   [provider]  Glucosamine-Chondroitin-MSM [phone] MG TABS Take by mouth.    [provider]  latanoprost (XALATAN) 0.005 % ophthalmic solution  06/05/14   [provider]  Multiple Vitamins-Minerals (CENTRUM SILVER ADULT 50+ PO) Take 1 mg by mouth.    [provider]  niacin 500 MG tablet Take by mouth.    [provider]  Omega-3 Fatty Acids (FISH OIL) 1200 MG CAPS Take by mouth.    [provider]   pantoprazole (PROTONIX) 40 MG tablet Take 1 tablet (40 mg total) by mouth daily. TAKE 1 TABLET BY MOUTH ONCE DAILY 06/11/14   Rubbie Battiest, RN  Saw Palmetto 450 MG CAPS Take 1 capsule by mouth daily.    [provider]  tiZANidine (ZANAFLEX) 4 MG tablet TAKE 1 TABLET (4 MG TOTAL) BY MOUTH AT BEDTIME. 02/01/15   Doss, Velora Heckler, RN  Turmeric (RA TURMERIC) 500 MG CAPS Take 1 capsule by mouth daily.    [provider]    Allergies as of 09/06/2016  . (No Known Allergies)    Family History  Problem Relation Age of Onset  . Heart disease Mother   . Heart disease Father   . Heart disease Brother     Social History   Social History  . Marital status: Married    Spouse name: N/A  . Number of children: N/A  . Years of education: N/A   Occupational History  . Not on file.   Social History Main Topics  . Smoking status: Former Smoker    Types: Cigarettes  . Smokeless tobacco: Former Systems developer    Quit date: 06/12/1993  . Alcohol use No  . Drug use: No  . Sexual activity: Yes    Partners: Female     Comment: 1  partner    Other Topics Concern  . Not on file   Social History Narrative   Works at [employer] in [employer] with wife    Children: 2    Pets: None   Left handed    Caffeine- 2 coffee, 1 soda a day    Enjoys building, working on his mustang     Review of Systems: See HPI, otherwise negative ROS  Physical Exam: BP 123/63   Pulse (!) 56   Temp (!) 96.9 F (36.1 C)   Ht 5\' 9"  (1.753 m)   Wt 78.9 kg (174 lb)   SpO2 100%   BMI 25.70 kg/m  General:   Alert,  pleasant and cooperative in NAD Head:  Normocephalic and atraumatic. Neck:  Supple; no masses or thyromegaly. Lungs:  Clear throughout to auscultation.    Heart:  Regular rate and rhythm. Abdomen:  Soft, nontender and nondistended. Normal bowel sounds, without guarding, and without rebound.   Neurologic:  Alert and  oriented x4;  grossly normal neurologically.  Impression/Plan: [name] is here for an endoscopy and colonoscopy to be performed for Screening colonoscopy and heartburn and previous Schatzki ring.  Risks, benefits, limitations, and alternatives regarding  endoscopy and colonoscopy have been reviewed with the patient.  Questions have been answered.  All parties agreeable.   Gaylyn Cheers, MD  11/07/2016, 1:11 PM

## 2016-11-07 NOTE — Op Note (Signed)
[facility] Gastroenterology Patient Name: Peter Baxter Procedure Date: 11/07/2016 1:09 PM MRN: [id number] Account #: [id number] Date of Birth: 08-31-1950 Admit Type: Outpatient Age: 66 Room: [facility] ENDO ROOM 3 Gender: Male Note Status: Finalized Procedure:            Upper GI endoscopy Indications:          Heartburn Providers:            Manya Silvas, MD Medicines:            Propofol per Anesthesia Complications:        No immediate complications. Procedure:            Pre-Anesthesia Assessment:                       - After reviewing the risks and benefits, the patient                        was deemed in satisfactory condition to undergo the                        procedure.                       After obtaining informed consent, the endoscope was                        passed under direct vision. Throughout the procedure,                        the patient's blood pressure, pulse, and oxygen                        saturations were monitored continuously. The Endoscope                        was introduced through the mouth, and advanced to the                        second part of duodenum. The upper GI endoscopy was                        accomplished without difficulty. The patient tolerated                        the procedure well. Findings:      LA Grade A (one or more mucosal breaks less than 5 mm, not extending       between tops of 2 mucosal folds) esophagitis with no bleeding was found       40 cm from the incisors. Biopsies were taken with a cold forceps for       histology.      Diffuse minimal inflammation characterized by erythema and granularity       was found in the gastric antrum. Biopsies were taken with a cold forceps       for histology.      The examined duodenum was normal. Impression:           - LA Grade A reflux esophagitis. Rule out Barrett's  esophagus. Biopsied.                       - Gastritis.  Biopsied.                       - Normal examined duodenum. Recommendation:       - Await pathology results. Manya Silvas, MD 11/07/2016 1:30:22 PM This report has been signed electronically. Number of Addenda: 0 Note Initiated On: 11/07/2016 1:09 PM      [facility]

## 2016-11-07 NOTE — Anesthesia Postprocedure Evaluation (Signed)
Anesthesia Post Note  Patient: Peter Baxter  Procedure(s) Performed: Procedure(s) (LRB): COLONOSCOPY WITH PROPOFOL (N/A) ESOPHAGOGASTRODUODENOSCOPY (EGD) WITH PROPOFOL (N/A)  Patient location during evaluation: Endoscopy Anesthesia Type: General Level of consciousness: awake and alert Pain management: pain level controlled Vital Signs Assessment: post-procedure vital signs reviewed and stable Respiratory status: spontaneous breathing and respiratory function stable Cardiovascular status: stable Anesthetic complications: no     Last Vitals:  Vitals:   11/07/16 1237 11/07/16 1355  BP: 123/63 (!) 92/58  Pulse: (!) 56   Temp: (!) 36.1 C (!) 36.2 C  SpO2: 100%     Last Pain:  Vitals:   11/07/16 1355  TempSrc: Tympanic                 Rhema Boyett K

## 2016-11-08 ENCOUNTER — Encounter: Payer: Self-pay | Admitting: Unknown Physician Specialty

## 2016-11-12 LAB — SURGICAL PATHOLOGY

## 2016-12-26 ENCOUNTER — Encounter: Payer: Self-pay | Admitting: Urology

## 2016-12-26 ENCOUNTER — Ambulatory Visit: Payer: Medicare Other | Admitting: Urology

## 2016-12-26 NOTE — Progress Notes (Deleted)
12/26/2016 9:03 AM   [name] Apr 15, 1950 [id number]  Referring provider: No referring provider defined for this encounter.  No chief complaint on file.   HPI: ***   Most recent PSA 0.78 on 04/12/2016  PMH: Past Medical History:  Diagnosis Date  . AF (atrial fibrillation) ([place])   . Anemia   . CAD (coronary artery disease)   . CAD (coronary artery disease)   . Carotid artery stenosis   . Duodenal bulb ulcer   . Gastropathy   . GERD (gastroesophageal reflux disease)   . Glaucoma   . Heart disease   . Hepatitis B   . Hypertension   . Inguinal pain   . Pre-diabetes     Surgical History: Past Surgical History:  Procedure Laterality Date  . angioplasty and stent    . cad    . CARDIAC CATHETERIZATION  2009  . cc/stents    . COLONOSCOPY, ESOPHAGOGASTRODUODENOSCOPY (EGD) AND ESOPHAGEAL DILATION    . CORONARY ANGIOPLASTY    . stents N/A 2005  . VASCULAR SURGERY      Home Medications:  Allergies as of 12/26/2016   No Known Allergies     Medication List        Accurate as of 12/26/16  9:03 AM. Always use your most recent med list.          aspirin EC 81 MG tablet Take by mouth.   atorvastatin 10 MG tablet Commonly known as:  LIPITOR   CENTRUM SILVER ADULT 50+ PO Take 1 mg by mouth.   Fish Oil 1200 MG Caps Take by mouth.   Glucosamine-Chondroitin-MSM 500-400-166 MG Tabs Take by mouth.   latanoprost 0.005 % ophthalmic solution Commonly known as:  XALATAN   metoprolol succinate 25 MG 24 hr tablet Commonly known as:  TOPROL-XL Take 0.5 tablets (12.5 mg total) by mouth daily. TAKE 1/2 TABLET BY MOUTH EVERY DAY   niacin 500 MG tablet Take by mouth.   omeprazole 20 MG capsule Commonly known as:  PRILOSEC Take 20 mg by mouth daily.   pantoprazole 40 MG tablet Commonly known as:  PROTONIX Take 1 tablet (40 mg total) by mouth daily. TAKE 1 TABLET BY MOUTH ONCE DAILY   RA TURMERIC 500 MG Caps Generic drug:  Turmeric Take 1 capsule by mouth  daily.   ranitidine 300 MG tablet Commonly known as:  ZANTAC Take 300 mg by mouth at bedtime.   Saw Palmetto 450 MG Caps Take 1 capsule by mouth daily.   tiZANidine 4 MG tablet Commonly known as:  ZANAFLEX TAKE 1 TABLET (4 MG TOTAL) BY MOUTH AT BEDTIME.       Allergies: No Known Allergies  Family History: Family History  Problem Relation Age of Onset  . Heart disease Mother   . Heart disease Father   . Heart disease Brother     Social History:  reports that he has quit smoking. His smoking use included cigarettes. He quit smokeless tobacco use about 23 years ago. He reports that he does not drink alcohol or use drugs.  ROS:                                        Physical Exam: There were no vitals taken for this visit.  Constitutional:  Alert and oriented, No acute distress. HEENT: [place] AT, moist mucus membranes.  Trachea midline, no masses. Cardiovascular: No clubbing,  cyanosis, or edema. Respiratory: Normal respiratory effort, no increased work of breathing. GI: Abdomen is soft, nontender, nondistended, no abdominal masses GU: No CVA tenderness. *** Skin: No rashes, bruises or suspicious lesions. Lymph: No cervical or inguinal adenopathy. Neurologic: Grossly intact, no focal deficits, moving all 4 extremities. Psychiatric: Normal mood and affect.  Laboratory Data: Lab Results  Component Value Date   WBC 8.4 11/28/2007   HGB 14.3 11/28/2007   HCT 42.3 11/28/2007   MCV 98.4 11/28/2007   PLT 152 11/28/2007    Lab Results  Component Value Date   CREATININE 0.98 11/28/2007    No results found for: PSA1  No results found for: TESTOSTERONE  No results found for: HGBA1C  Urinalysis No results found for: SPECGRAV, PHUR, COLORU, APPEARANCEUR, LEUKOCYTESUR, PROTEINUR, GLUCOSEU, KETONESU, RBCU, BILIRUBINUR, UUROB, NITRITE  No results found for: LABMICR, [place], RBCUA, LABEPIT, MUCUS, BACTERIA  Pertinent Imaging: *** No results found  for this or any previous visit. No results found for this or any previous visit. No results found for this or any previous visit. No results found for this or any previous visit. No results found for this or any previous visit. No results found for this or any previous visit. No results found for this or any previous visit. No results found for this or any previous visit.  Assessment & Plan:  ***  There are no diagnoses linked to this encounter.  No Follow-up on file.  Hollice Espy, MD  [facility] [address], [place], [place] [address] [phone]

## 2017-01-10 ENCOUNTER — Other Ambulatory Visit: Payer: Self-pay

## 2017-01-10 DIAGNOSIS — R3912 Poor urinary stream: Secondary | ICD-10-CM

## 2017-01-11 ENCOUNTER — Encounter: Payer: Self-pay | Admitting: Urology

## 2017-01-11 ENCOUNTER — Ambulatory Visit (INDEPENDENT_AMBULATORY_CARE_PROVIDER_SITE_OTHER): Payer: Medicare Other | Admitting: Urology

## 2017-01-11 VITALS — BP 112/65 | HR 57 | Ht 68.0 in | Wt 170.0 lb

## 2017-01-11 DIAGNOSIS — N138 Other obstructive and reflux uropathy: Secondary | ICD-10-CM | POA: Diagnosis not present

## 2017-01-11 DIAGNOSIS — N433 Hydrocele, unspecified: Secondary | ICD-10-CM | POA: Diagnosis not present

## 2017-01-11 DIAGNOSIS — N401 Enlarged prostate with lower urinary tract symptoms: Secondary | ICD-10-CM

## 2017-01-11 DIAGNOSIS — R3912 Poor urinary stream: Secondary | ICD-10-CM

## 2017-01-11 LAB — BLADDER SCAN AMB NON-IMAGING

## 2017-01-11 MED ORDER — TAMSULOSIN HCL 0.4 MG PO CAPS: 0.4000 mg | ORAL_CAPSULE | Freq: Every day | ORAL | 11 refills | Status: DC

## 2017-01-11 NOTE — Patient Instructions (Signed)
Hydrocele, Adult A hydrocele is a collection of fluid in the loose pouch of skin that holds the testicles (scrotum). Usually, it affects only one testicle. What are the causes? This condition may be caused by:  An injury to the scrotum.  An infection.  A tumor or cancer of the testicle.  Twisting of a testicle.  Decreased blood flow to the scrotum.  What are the signs or symptoms? A hydrocele feels like a water-filled balloon. It may also feel heavy. A hydrocele can cause:  Swelling of the scrotum. The swelling may decrease when you lie down.  Swelling of the groin.  Mild discomfort in the scrotum.  Pain. This can develop if the hydrocele was caused by infection or twisting.  How is this diagnosed? This condition may be diagnosed with a medical history, physical exam, and imaging tests. You may also have blood and urine tests to check for infection. How is this treated? Treatment may include:  Watching and waiting, particularly if the hydrocele causes no symptoms.  Treatment of the underlying condition. This may include using antibiotic medicine.  Surgery to drain the fluid. Some surgical options include: ? Needle aspiration. For this procedure, a needle is used to drain fluid. ? Hydrocelectomy. For this procedure, an incision is made in the scrotum to remove the fluid sac.  Follow these instructions at home:  Keep all follow-up visits as told by your health care provider. This is important.  Watch the hydrocele for any changes.  Take over-the-counter and prescription medicines only as told by your health care provider.  If you were prescribed an antibiotic medicine, use it as told by your health care provider. Do not stop using the antibiotic even if your condition improves. Contact a health care provider if:  The swelling in your scrotum or groin gets worse.  The hydrocele becomes red, firm, tender to the touch, or painful.  You notice any changes in the  hydrocele.  You have a fever. This information is not intended to replace advice given to you by your health care provider. Make sure you discuss any questions you have with your health care provider. Document Released: 07/19/2009 Document Revised: 07/07/2015 Document Reviewed: 01/25/2014 Elsevier Interactive Patient Education  2018 Elsevier Inc.  

## 2017-01-11 NOTE — Progress Notes (Signed)
01/11/2017 4:47 PM   Peter Baxter Peter Baxter 07-22-1950 [id number]  Referring provider: No referring provider defined for this encounter.  Chief Complaint  Patient presents with  . Hydrocele    New Patient    HPI: 66 year old male who presents today for further evaluation of left scrotal swelling and symptoms.  He reports noticing left-sided scrotal swelling for the past several years.  Over the past several months, is increased in size now approximately the size of a softball.  It is bothersome to him and has to be careful about positioning and scrotal trauma.  No severe pain.  He was seen and evaluated by Dr. Tamala Julian for left groin pain found to have no evidence of inguinal hernia.  He does have occational slow urinary stream with post void dribbling.  He also gets up several to urinate.  Been on medication in the past for enlarged prostate but cannot recall the names of the medication.  He recalls that this was effective in the past.  He would like to try these medications again.  He denies any dysuria, gross hematuria, or UTIs.  Most recent PSA 0.78 on 04/12/2016   PMH: Past Medical History:  Diagnosis Date  . AF (atrial fibrillation) ([place])   . Anemia   . CAD (coronary artery disease)   . CAD (coronary artery disease)   . Carotid artery stenosis   . Duodenal bulb ulcer   . Gastropathy   . GERD (gastroesophageal reflux disease)   . Glaucoma   . Heart disease   . Hepatitis B   . Hypertension   . Inguinal pain   . Pre-diabetes     Surgical History: Past Surgical History:  Procedure Laterality Date  . angioplasty and stent    . cad    . CARDIAC CATHETERIZATION  2009  . cc/stents    . COLONOSCOPY WITH PROPOFOL N/A 11/07/2016   Procedure: COLONOSCOPY WITH PROPOFOL;  Surgeon: Manya Silvas, MD;  Location[facility] END[facility]COPY;  Service: Endoscopy;  Laterality: N/A;  . COLONOSCOPY, ESOPHAGOGASTRODUODENOSCOPY (EGD) AND ESOPHAGEAL DILATION    . CORONARY ANGIOPLASTY    .  ESOPHAGOGASTRODUODENOSCOPY (EGD) WITH PROPOFOL N/A 11/07/2016   Procedure: ESOPHAGOGASTRODUODENOSCOPY (EGD) WITH PROPOFOL;  Surgeon: Manya Silvas, MD;  Location: [facility] ENDOSCOPY;  Service: Endoscopy;  Laterality: N/A;  . stents N/A 2005  . VASCULAR SURGERY      Home Medications:  Allergies as of 01/11/2017   No Known Allergies     Medication List        Accurate as of 01/11/17 11:59 PM. Always use your most recent med list.          aspirin EC 81 MG tablet Take by mouth.   atorvastatin 10 MG tablet Commonly known as:  LIPITOR   CENTRUM SILVER ADULT 50+ PO Take 1 mg by mouth.   Fish Oil 1200 MG Caps Take by mouth.   Glucosamine-Chondroitin-MSM 500-400-166 MG Tabs Take by mouth.   latanoprost 0.005 % ophthalmic solution Commonly known as:  XALATAN   metoprolol succinate 25 MG 24 hr tablet Commonly known as:  TOPROL-XL Take 0.5 tablets (12.5 mg total) by mouth daily. TAKE 1/2 TABLET BY MOUTH EVERY DAY   niacin 500 MG tablet Take by mouth.   omeprazole 20 MG capsule Commonly known as:  PRILOSEC Take 20 mg by mouth daily.   pantoprazole 40 MG tablet Commonly known as:  PROTONIX Take 1 tablet (40 mg total) by mouth daily. TAKE 1 TABLET BY MOUTH ONCE DAILY  RA TURMERIC 500 MG Caps Generic drug:  Turmeric Take 1 capsule by mouth daily.   ranitidine 300 MG tablet Commonly known as:  ZANTAC Take 300 mg by mouth at bedtime.   Saw Palmetto 450 MG Caps Take 1 capsule by mouth daily.   tamsulosin 0.4 MG Caps capsule Commonly known as:  FLOMAX Take 1 capsule (0.4 mg total) by mouth daily.   tiZANidine 4 MG tablet Commonly known as:  ZANAFLEX TAKE 1 TABLET (4 MG TOTAL) BY MOUTH AT BEDTIME.       Allergies: No Known Allergies  Family History: Family History  Problem Relation Age of Onset  . Heart disease Mother   . Heart disease Father   . Heart disease Brother     Social History:  reports that he has quit smoking. His smoking use included  cigarettes. He quit smokeless tobacco use about 23 years ago. He reports that he does not drink alcohol or use drugs.  ROS: UROLOGY Frequent Urination?: No Hard to postpone urination?: No Burning/pain with urination?: No Get up at night to urinate?: Yes Leakage of urine?: No Urine stream starts and stops?: No Trouble starting stream?: No Do you have to strain to urinate?: No Blood in urine?: No Urinary tract infection?: No Sexually transmitted disease?: No Injury to kidneys or bladder?: No Painful intercourse?: No Weak stream?: Yes Erection problems?: No Penile pain?: No  Gastrointestinal Nausea?: No Vomiting?: No Indigestion/heartburn?: No Diarrhea?: No Constipation?: No  Constitutional Fever: No Night sweats?: No Weight loss?: No Fatigue?: No  Skin Skin rash/lesions?: No Itching?: No  Eyes Blurred vision?: No Double vision?: No  Ears/Nose/Throat Sore throat?: No Sinus problems?: No  Hematologic/Lymphatic Swollen glands?: No Easy bruising?: No  Cardiovascular Leg swelling?: No Chest pain?: No  Respiratory Cough?: No Shortness of breath?: No  Endocrine Excessive thirst?: No  Musculoskeletal Back pain?: No Joint pain?: No  Neurological Headaches?: No Dizziness?: No  Psychologic Depression?: No Anxiety?: No  Physical Exam: BP 112/65   Pulse (!) 57   Ht 5\' 8"  (1.727 m)   Wt 170 lb (77.1 kg)   BMI 25.85 kg/m   Constitutional:  Alert and oriented, No acute distress. HEENT: [place] AT, moist mucus membranes.  Trachea midline, no masses. Cardiovascular: No clubbing, cyanosis, or edema. Respiratory: Normal respiratory effort, no increased work of breathing. GI: Abdomen is soft, nontender, nondistended, no abdominal masses GU:  bilateral descended testicles.  Circumcised phallus.  Left hemiscrotum enlarged, approximately softball size consistent with hydrocele.  Unable to palpate left testicle due to presence of hydrocele fluid.  Right testicle  normal.  No scrotal skin changes.  No palpable inguinal hernia appreciated. Rectal: Mildly enlarged prostate, non tender, no nodules.   Skin: No rashes, bruises or suspicious lesions. Neurologic: Grossly intact, no focal deficits, moving all 4 extremities. Psychiatric: Normal mood and affect.  Laboratory Data: Hemoglobin 13.4 on 3/18 A1c 5.9 Creatinine 1.3  PSA as above  Urinalysis Urinalysis ordered, patient did not stop by lab  Pertinent Imaging: Results for orders placed or performed in visit on 01/11/17  BLADDER SCAN AMB NON-IMAGING  Result Value Ref Range   Scan Result 79ml     Assessment & Plan:   1. Left hydrocele Moderate-sized symptomatic left hydrocele Discussed options for management of this including observation, percutaneous drainage, and a hydrocelectomy.  We discussed the efficacy and recurrence rates of each.  If he is interested in treatment, I would strongly recommend hydrocelectomy given significantly lower recurrence rate.  We discussed outpatient  surgery, preop, postoperative course.  The risks including bleeding, infection, damage to surrounding structures, recurrence, hematoma, chronic pain were all reviewed.  All questions answered.  He does not want to book surgery at this time and would like to think about his options.  He will let us know if he like to proceed.  2. BPH with urinary obstruction Primarily obstructive voiding symptoms with adequate emptying today Will start Flomax 0.4 mg, discussed side effects of medication - BLADDER SCAN AMB NON-IMAGING  3. Weak urinary stream As above  He will follow-up on an as-needed basis.  If his symptoms fail to improve or resolve with Flomax, he was advised to return.  Hollice Espy, MD  [facility] [address], [place], [place] [address] [phone]

## 2017-01-25 ENCOUNTER — Telehealth: Payer: Self-pay

## 2017-01-25 NOTE — Telephone Encounter (Signed)
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  Most people do not mind it.  This medication I provided was to help with your urinary symptoms.  If it is not helping, please follow-up sooner.  If retrograde ejaculation is a problem for you, we can change to a different medication but most of the medications in this class may cause the same issue.    If he is extremely bothered by his retrograde ejaculation, try Cardura.  Hollice Espy, MD

## 2017-01-25 NOTE — Telephone Encounter (Signed)
Pt called stating since starting tamsulosin he has been having retrograde ejaculation. Pt inquired about needing to stay on medication and if so can he have a different one. Please advise.

## 2017-01-30 MED ORDER — DOXAZOSIN MESYLATE 4 MG PO TABS: 4.0000 mg | ORAL_TABLET | Freq: Every day | ORAL | 3 refills | Status: DC

## 2017-01-30 NOTE — Telephone Encounter (Signed)
No answer. - Cardura sent to pharmacy.

## 2017-03-15 HISTORY — PX: CORONARY ANGIOPLASTY: SHX604

## 2017-04-17 ENCOUNTER — Telehealth (HOSPITAL_COMMUNITY): Payer: Self-pay | Admitting: Cardiology

## 2017-04-17 NOTE — Telephone Encounter (Signed)
Called patient to schedule for new patient appt with Dr. Aundra Dubin per Shirley Muscat, RN.  No answer or VM on pt's home/mobile phone.  Will try to call patient back.  Per Shirley Muscat, RN, next available new pt slot is fine.

## 2017-06-10 ENCOUNTER — Other Ambulatory Visit: Payer: Self-pay

## 2017-06-10 ENCOUNTER — Encounter (HOSPITAL_COMMUNITY): Payer: Self-pay | Admitting: Cardiology

## 2017-06-10 ENCOUNTER — Ambulatory Visit (HOSPITAL_COMMUNITY)
Admission: RE | Admit: 2017-06-10 | Discharge: 2017-06-10 | Disposition: A | Discharge status: Home / Self Care | Payer: Medicare Other | Source: Ambulatory Visit | Attending: Cardiology | Admitting: Cardiology

## 2017-06-10 VITALS — BP 122/62 | HR 65 | Wt 180.5 lb

## 2017-06-10 DIAGNOSIS — Z8249 Family history of ischemic heart disease and other diseases of the circulatory system: Secondary | ICD-10-CM | POA: Diagnosis not present

## 2017-06-10 DIAGNOSIS — K219 Gastro-esophageal reflux disease without esophagitis: Secondary | ICD-10-CM | POA: Insufficient documentation

## 2017-06-10 DIAGNOSIS — Z79899 Other long term (current) drug therapy: Secondary | ICD-10-CM | POA: Insufficient documentation

## 2017-06-10 DIAGNOSIS — I2510 Atherosclerotic heart disease of native coronary artery without angina pectoris: Secondary | ICD-10-CM | POA: Diagnosis present

## 2017-06-10 DIAGNOSIS — Z7982 Long term (current) use of aspirin: Secondary | ICD-10-CM | POA: Insufficient documentation

## 2017-06-10 DIAGNOSIS — I10 Essential (primary) hypertension: Secondary | ICD-10-CM | POA: Insufficient documentation

## 2017-06-10 DIAGNOSIS — R0683 Snoring: Secondary | ICD-10-CM | POA: Diagnosis not present

## 2017-06-10 DIAGNOSIS — I25118 Atherosclerotic heart disease of native coronary artery with other forms of angina pectoris: Secondary | ICD-10-CM

## 2017-06-10 DIAGNOSIS — Z8711 Personal history of peptic ulcer disease: Secondary | ICD-10-CM | POA: Diagnosis not present

## 2017-06-10 DIAGNOSIS — Z951 Presence of aortocoronary bypass graft: Secondary | ICD-10-CM | POA: Diagnosis not present

## 2017-06-10 DIAGNOSIS — I4891 Unspecified atrial fibrillation: Secondary | ICD-10-CM | POA: Insufficient documentation

## 2017-06-10 DIAGNOSIS — E785 Hyperlipidemia, unspecified: Secondary | ICD-10-CM | POA: Diagnosis not present

## 2017-06-10 MED ORDER — LISINOPRIL 5 MG PO TABS: 5.0000 mg | ORAL_TABLET | Freq: Every day | ORAL | 6 refills | Status: DC

## 2017-06-10 NOTE — Patient Instructions (Signed)
Start Lisinopril 5 mg daily  Labs in 2 weeks, can have done at your primary care doctors office  Your physician has requested that you have an echocardiogram. Echocardiography is a painless test that uses sound waves to create images of your heart. It provides your doctor with information about the size and shape of your heart and how well your heart's chambers and valves are working. This procedure takes approximately one hour. There are no restrictions for this procedure.  Your physician has recommended that you have a sleep study. This test records several body functions during sleep, including: brain activity, eye movement, oxygen and carbon dioxide blood levels, heart rate and rhythm, breathing rate and rhythm, the flow of air through your mouth and nose, snoring, body muscle movements, and chest and belly movement.  Your physician recommends that you schedule a follow-up appointment in: 3 months

## 2017-06-11 ENCOUNTER — Telehealth (HOSPITAL_COMMUNITY): Payer: Self-pay | Admitting: *Deleted

## 2017-06-11 ENCOUNTER — Encounter (HOSPITAL_COMMUNITY): Payer: Self-pay | Admitting: *Deleted

## 2017-06-11 NOTE — Progress Notes (Signed)
PCP: Dr. Netty Starring Cardiology: Dr. Aundra Dubin  67 yo with history of CAD s/p CABG referred by Dr. Netty Starring for followup of CAD.  I take care of his brother and have met him in the past through his brother's care Peter Baxter).   Cardiology care in the past has been at [facility]   He had CABG in 2017, it sounds like it was complicated by peri-operative atrial fibrillation that has not recurred.  He had atypical chest pain earlier this year, and had a stress echo in 2/19.  The stress echo was suggestive of ischemia.  He then had a cardiac cath in 2/19.  I cannot view the report in [place] unfortunately.  The patient states that he was told 2/4 of his bypass grafts were chronically occluded but that he had grown collaterals to the vessels supplied by the occluded SVGs.  There was no intervention.   He seeks a second opinion for his further cardiac care as he does not feel like the procedure was explained well to him.    He really seems to be doing well currently.  He works full time at [employer], is very active there with lifting/carrying/walking.  He does not have any significant chest pain or exertional dyspnea.  No palpitations.  He is in NSR today. Main complaint is actually daytime sleepiness, and his wife says that he snores at night.   Labs (2/19): LDL 46, HDL 46, K 4.1, creatinine 1.1  PMH: 1. CAD: CABG in 2017 with LIMA-LAD, SVG-PDA, SVG-OM1, SVG-D.  - Stress echo (2/19) was suggestive of ischemia.  - LHC (2/19): Report not available to me on [facility].  From discussion with patient, it sounds like 2 SVGs were occluded (probably chronically) with the native vessels now supplied by collaterals, no intervention though necessary.  2. HTN 3. Peri-CABG atrial fibrillation: Not anticoagulated.  4. Hyperlipidemia.  5. Left shoulder rotator cuff injury.  6. Hydrocele 7. H/o PUD 8. GERD  SH: Married, lives in [place], nonsmoker, works at [employer] History  Problem Relation Age of  Onset  . Heart disease Mother   . Heart disease Father   . Heart disease Brother    ROS: All systems reviewed and negative except as per HPI.   Current Outpatient Medications  Medication Sig Dispense Refill  . aspirin EC 81 MG tablet Take by mouth.    Marland Kitchen atorvastatin (LIPITOR) 10 MG tablet     . latanoprost (XALATAN) 0.005 % ophthalmic solution     . metoprolol succinate (TOPROL-XL) 25 MG 24 hr tablet Take 25 mg by mouth daily.    . pantoprazole (PROTONIX) 40 MG tablet Take 1 tablet (40 mg total) by mouth daily. TAKE 1 TABLET BY MOUTH ONCE DAILY 30 tablet 5  . lisinopril (PRINIVIL,ZESTRIL) 5 MG tablet Take 1 tablet (5 mg total) by mouth daily. 30 tablet 6   No current facility-administered medications for this encounter.    BP 122/62   Pulse 65   Wt 180 lb 8 oz (81.9 kg)   SpO2 99%   BMI 27.44 kg/m  General: NAD Neck: No JVD, no thyromegaly or thyroid nodule.  Lungs: Clear to auscultation bilaterally with normal respiratory effort. CV: Nondisplaced PMI.  Heart regular S1/S2, no S3/S4, no murmur.  No peripheral edema.  No carotid bruit.  Normal pedal pulses.  Abdomen: Soft, nontender, no hepatosplenomegaly, no distention.  Skin: Intact without lesions or rashes.  Neurologic: Alert and oriented x 3.  Psych: Normal affect. Extremities: No clubbing or  cyanosis.  HEENT: Normal.   Assessment/Plan:  1. CAD: s/p CABG x 4 in 2017.  Had atypical symptoms but abnormal stress echo in 2/19.  This was followed by cath showing 2/4 grafts occluded (presumably SVGs).  He was told that the vessels supplied by the occluded grafts had collaterals.  He has no chest pain or exertional dyspnea.   - I will request cath report from [facility], would be ideal also to get copy of cath (he has a disk with him today but unfortunately it is password protected and we do not have the passwork).  - We discussed the presumed findings of the cath, and I assured him that based on what he was telling me, medical  management was the appropriate approach here.  - Continue ASA 81, atorvastatin, and Toprol XL.  - I will have him start ACEI, will use lisinopril 5 mg daily to start, for secondary prevention. BMET 2 wks.  - I will arrange for an echocardiogram to assess LV function in setting of loss of 2 grafts.  2. Hyperlipidemia: good lipids in 2/19.  3. Atrial fibrillation: As far as I can tell, only noted peri-op CABG.  If recurs, will need anticoagulation.  4. Suspect OSA: I will arrange for sleep study.  5. HTN: BP controlled.   Loralie Champagne 06/11/2017

## 2017-06-11 NOTE — Telephone Encounter (Signed)
Pt called stating he needed a letter stating he was here for an appt yesterday.  Letter completed and faxed to [place] at [phone]

## 2017-06-21 ENCOUNTER — Ambulatory Visit (INDEPENDENT_AMBULATORY_CARE_PROVIDER_SITE_OTHER): Payer: Medicare Other

## 2017-06-21 ENCOUNTER — Other Ambulatory Visit (HOSPITAL_COMMUNITY): Payer: Self-pay | Admitting: Cardiology

## 2017-06-21 ENCOUNTER — Other Ambulatory Visit: Payer: Self-pay

## 2017-06-21 DIAGNOSIS — I25118 Atherosclerotic heart disease of native coronary artery with other forms of angina pectoris: Secondary | ICD-10-CM | POA: Diagnosis not present

## 2017-06-21 DIAGNOSIS — I4891 Unspecified atrial fibrillation: Secondary | ICD-10-CM

## 2017-07-03 ENCOUNTER — Ambulatory Visit (HOSPITAL_BASED_OUTPATIENT_CLINIC_OR_DEPARTMENT_OTHER): Payer: Medicare Other | Attending: Cardiology | Admitting: Cardiology

## 2017-07-03 VITALS — Ht 69.0 in | Wt 170.0 lb

## 2017-07-03 DIAGNOSIS — R0683 Snoring: Secondary | ICD-10-CM

## 2017-07-03 DIAGNOSIS — I509 Heart failure, unspecified: Secondary | ICD-10-CM

## 2017-07-03 DIAGNOSIS — I493 Ventricular premature depolarization: Secondary | ICD-10-CM | POA: Insufficient documentation

## 2017-07-08 NOTE — Procedures (Signed)
   Patient Name: Peter Baxter, Peter Baxter Study Date:01/29/2017 07/03/2017 Gender: Male D.O.B: 1950/06/13 Age (years): 66 Referring Provider: Loralie Champagne Height (inches): 79 Interpreting Physician: Fransico Him MD, ABSM Weight (lbs): 170 RPSGT: Laren Everts BMI: 25 MRN: [id number] Neck Size: 15.00  CLINICAL INFORMATION Sleep Study Type: NPSG  Indication for sleep study: Congestive Heart Failure, Hypertension, Obesity, Snoring  Epworth Sleepiness Score: 3  SLEEP STUDY TECHNIQUE As per the AASM Manual for the Scoring of Sleep and Associated Events v2.3 (April 2016) with a hypopnea requiring 4% desaturations.  The channels recorded and monitored were frontal, central and occipital EEG, electrooculogram (EOG), submentalis EMG (chin), nasal and oral airflow, thoracic and abdominal wall motion, anterior tibialis EMG, snore microphone, electrocardiogram, and pulse oximetry.  MEDICATIONS Medications self-administered by patient taken the night of the study : LATANOPROST  SLEEP ARCHITECTURE The study was initiated at 10:25:48 PM and ended at 5:22:51 AM.  Sleep onset time was 2.4 minutes and the sleep efficiency was 79.7%%. The total sleep time was 332.5 minutes.  Stage REM latency was 84.5 minutes.  The patient spent 12.5%% of the night in stage N1 sleep, 63.0%% in stage N2 sleep, 0.0%% in stage N3 and 24.51% in REM.  Alpha intrusion was absent.  Supine sleep was 34.59%.  RESPIRATORY PARAMETERS The overall apnea/hypopnea index (AHI) was 3.2 per hour. There were 12 total apneas, including 10 obstructive, 2 central and 0 mixed apneas. There were 6 hypopneas and 26 RERAs.  The AHI during Stage REM sleep was 6.6 per hour.  AHI while supine was 7.3 per hour.  The mean oxygen saturation was 94.6%. The minimum SpO2 during sleep was 80.0%.  soft snoring was noted during this study.  CARDIAC DATA The 2 lead EKG demonstrated sinus rhythm. The mean heart rate was 50.4 beats per minute.  Other EKG findings include: PVCs.  LEG MOVEMENT DATA The total PLMS were 0 with a resulting PLMS index of 0.0. Associated arousal with leg movement index was 0.0 .  IMPRESSIONS - No significant obstructive sleep apnea occurred during this study (AHI = 3.2/h). - No significant central sleep apnea occurred during this study (CAI = 0.4/h). - Moderate oxygen desaturation was noted during this study (Min O2 = 80.0%). - The patient snored with soft snoring volume. - EKG findings include PVCs. - Clinically significant periodic limb movements did not occur during sleep. No significant associated arousals.  DIAGNOSIS - Normal Study  RECOMMENDATIONS - Avoid alcohol, sedatives and other CNS depressants that may worsen sleep apnea and disrupt normal sleep architecture. - Sleep hygiene should be reviewed to assess factors that may improve sleep quality. - Weight management and regular exercise should be initiated or continued if appropriate.  [Electronically signed] 07/08/2017 09:39 PM  Fransico Him MD, ABSM Diplomate, American Board of Sleep Medicine

## 2017-07-09 ENCOUNTER — Telehealth: Payer: Self-pay | Admitting: *Deleted

## 2017-07-09 NOTE — Telephone Encounter (Signed)
Informed patient of sleep study results and patient understanding was verbalized. Pt is aware and agreeable to normal results 

## 2017-07-09 NOTE — Telephone Encounter (Signed)
-----   Message from Sueanne Margarita, MD sent at 07/08/2017  9:48 PM EDT ----- Please let patient know that sleep study showed no significant sleep apnea.

## 2017-09-10 ENCOUNTER — Ambulatory Visit (HOSPITAL_COMMUNITY)
Admission: RE | Admit: 2017-09-10 | Discharge: 2017-09-10 | Disposition: A | Discharge status: Home / Self Care | Payer: Medicare Other | Source: Ambulatory Visit | Attending: Cardiology | Admitting: Cardiology

## 2017-09-10 ENCOUNTER — Encounter (HOSPITAL_COMMUNITY): Payer: Self-pay | Admitting: Cardiology

## 2017-09-10 VITALS — BP 128/78 | HR 58 | Wt 179.8 lb

## 2017-09-10 DIAGNOSIS — Z8711 Personal history of peptic ulcer disease: Secondary | ICD-10-CM | POA: Diagnosis not present

## 2017-09-10 DIAGNOSIS — K219 Gastro-esophageal reflux disease without esophagitis: Secondary | ICD-10-CM | POA: Insufficient documentation

## 2017-09-10 DIAGNOSIS — Z8249 Family history of ischemic heart disease and other diseases of the circulatory system: Secondary | ICD-10-CM | POA: Insufficient documentation

## 2017-09-10 DIAGNOSIS — I10 Essential (primary) hypertension: Secondary | ICD-10-CM | POA: Insufficient documentation

## 2017-09-10 DIAGNOSIS — Z79899 Other long term (current) drug therapy: Secondary | ICD-10-CM | POA: Diagnosis not present

## 2017-09-10 DIAGNOSIS — I25118 Atherosclerotic heart disease of native coronary artery with other forms of angina pectoris: Secondary | ICD-10-CM | POA: Diagnosis present

## 2017-09-10 DIAGNOSIS — Z951 Presence of aortocoronary bypass graft: Secondary | ICD-10-CM | POA: Diagnosis not present

## 2017-09-10 DIAGNOSIS — Z7982 Long term (current) use of aspirin: Secondary | ICD-10-CM | POA: Insufficient documentation

## 2017-09-10 DIAGNOSIS — E785 Hyperlipidemia, unspecified: Secondary | ICD-10-CM | POA: Insufficient documentation

## 2017-09-10 DIAGNOSIS — I2510 Atherosclerotic heart disease of native coronary artery without angina pectoris: Secondary | ICD-10-CM | POA: Insufficient documentation

## 2017-09-10 LAB — BASIC METABOLIC PANEL
Anion gap: 8 (ref 5–15)
BUN: 17 mg/dL (ref 8–23)
CHLORIDE: 106 mmol/L (ref 98–111)
CO2: 25 mmol/L (ref 22–32)
CREATININE: 1.55 mg/dL — AB (ref 0.61–1.24)
Calcium: 8.9 mg/dL (ref 8.9–10.3)
GFR calc non Af Amer: 44 mL/min — ABNORMAL LOW (ref >60)
GFR, EST AFRICAN AMERICAN: 51 mL/min — AB (ref >60)
Glucose, Bld: 133 mg/dL — ABNORMAL HIGH (ref 70–99)
POTASSIUM: 3.6 mmol/L (ref 3.5–5.1)
SODIUM: 139 mmol/L (ref 135–145)

## 2017-09-10 NOTE — Patient Instructions (Signed)
Routine lab work today. Will notify you of abnormal results, otherwise no news is good news!  No changes to medication at this time.  Follow up 6 months with Dr. Aundra Dubin. We will call you closer to this time, or you may call our office to schedule 1 month before you are due to be seen. Take all medication as prescribed the day of your appointment. Bring all medications with you to your appointment.  Do the following things EVERYDAY: 1) Weigh yourself in the morning before breakfast. Write it down and keep it in a log. 2) Take your medicines as prescribed 3) Eat low salt foods-Limit salt (sodium) to 2000 mg per day.  4) Stay as active as you can everyday 5) Limit all fluids for the day to less than 2 liters

## 2017-09-10 NOTE — Progress Notes (Signed)
PCP: Dr. Netty Starring Cardiology: Dr. Aundra Dubin  67 y.o. with history of CAD s/p CABG referred by Dr. Netty Starring for followup of CAD.  I take care of his brother and have met him in the past through his brother's care Warner Mccreedy).   Cardiology care in the past had been at [facility].   He had CABG in 2017, it sounds like it was complicated by peri-operative atrial fibrillation that has not recurred.  He had atypical chest pain earlier this year, and had a stress echo in 2/19.  The stress echo was suggestive of ischemia.  He then had a cardiac cath in 2/19.  I cannot view the report in [place] unfortunately.  The patient states that he was told 2/4 of his bypass grafts were chronically occluded but that he had grown collaterals to the vessels supplied by the occluded SVGs.  There was no intervention.  Echo in 5/19 showed EF 55-60% with mild AI.     He returns for followup of CAD.  He seems to be doing well.  No sleep apnea on 5/19 sleep study.  No exertional dyspnea or chest pain.  No lightheadedness.  No problems at work.  He has some left shoulder pain related to rotator cuff strain. Weight stable.  BP controlled.   Labs (2/19): LDL 46, HDL 46, K 4.1, creatinine 1.1  PMH: 1. CAD: CABG in 2017 with LIMA-LAD, SVG-PDA, SVG-OM1, SVG-D.  - Stress echo (2/19) was suggestive of ischemia.  - LHC (2/19): Report not available to me on [facility]where.  From discussion with patient, it sounds like 2 SVGs were occluded (probably chronically) with the native vessels now supplied by collaterals, no intervention though necessary.  - Echo (5/19): EF 55-60%, mild AI.  2. HTN 3. Peri-CABG atrial fibrillation: Not anticoagulated.  4. Hyperlipidemia.  5. Left shoulder rotator cuff injury.  6. Hydrocele 7. H/o PUD 8. GERD 9. Sleep study in 5/19 with no OSA.   SH: Married, lives in [place], nonsmoker, works at [employer] History  Problem Relation Age of Onset  . Heart disease Mother   . Heart disease  Father   . Heart disease Brother    ROS: All systems reviewed and negative except as per HPI.   Current Outpatient Medications  Medication Sig Dispense Refill  . aspirin EC 81 MG tablet Take by mouth.    Marland Kitchen atorvastatin (LIPITOR) 10 MG tablet     . latanoprost (XALATAN) 0.005 % ophthalmic solution     . lisinopril (PRINIVIL,ZESTRIL) 5 MG tablet Take 1 tablet (5 mg total) by mouth daily. 30 tablet 6  . metoprolol succinate (TOPROL-XL) 25 MG 24 hr tablet Take 25 mg by mouth daily.    . pantoprazole (PROTONIX) 40 MG tablet Take 1 tablet (40 mg total) by mouth daily. TAKE 1 TABLET BY MOUTH ONCE DAILY 30 tablet 5   No current facility-administered medications for this encounter.    BP 128/78   Pulse (!) 58   Wt 179 lb 12.8 oz (81.6 kg)   SpO2 96%   BMI 26.55 kg/m  General: NAD Neck: No JVD, no thyromegaly or thyroid nodule.  Lungs: Clear to auscultation bilaterally with normal respiratory effort. CV: Nondisplaced PMI.  Heart regular S1/S2, no S3/S4, no murmur.  No peripheral edema.  No carotid bruit.  Normal pedal pulses.  Abdomen: Soft, nontender, no hepatosplenomegaly, no distention.  Skin: Intact without lesions or rashes.  Neurologic: Alert and oriented x 3.  Psych: Normal affect. Extremities: No clubbing or  cyanosis.  HEENT: Normal.   Assessment/Plan:  1. CAD: s/p CABG x 4 in 2017.  Had atypical symptoms but abnormal stress echo in 2/19.  This was followed by cath showing 2/4 grafts occluded (presumably SVGs).  He was told that the vessels supplied by the occluded grafts had collaterals.  Echo in 5/19 showed EF 55-60%.  He has no chest pain or exertional dyspnea.   - Continue ASA 81, atorvastatin, lisinopril, and Toprol XL.  - BMET today given lisinopril use.   2. Hyperlipidemia: good lipids in 2/19.  3. Atrial fibrillation: As far as I can tell, only noted peri-op CABG.  If recurs, will need anticoagulation.  4. HTN: BP controlled. No OSA on 5/19 sleep study.   Loralie Champagne 09/10/2017

## 2017-09-11 ENCOUNTER — Telehealth (HOSPITAL_COMMUNITY): Payer: Self-pay | Admitting: *Deleted

## 2017-09-11 MED ORDER — LISINOPRIL 5 MG PO TABS: 2.5000 mg | ORAL_TABLET | Freq: Every day | ORAL | 3 refills | Status: DC

## 2017-09-11 NOTE — Telephone Encounter (Signed)
Result Notes for Basic metabolic panel   Notes recorded by Darron Doom, RN on 09/11/2017 at 4:36 PM EDT Spoke with patient and he's agreeable with plan. MAR updated and prescription faxed to [facility] [phone]) for them to draw bmet and fax results to our clinic. ------  Notes recorded by Larey Dresser, MD on 09/10/2017 at 9:41 PM EDT Creatinine elevated. Decrease lisinopril to 2.5 mg daily and encourage increased hydration. Repeat BMET 2 wks.

## 2017-10-09 ENCOUNTER — Other Ambulatory Visit (HOSPITAL_COMMUNITY): Payer: Self-pay | Admitting: Cardiology

## 2017-11-27 ENCOUNTER — Other Ambulatory Visit: Payer: Self-pay | Admitting: Urology

## 2017-12-05 ENCOUNTER — Other Ambulatory Visit (HOSPITAL_COMMUNITY): Payer: Self-pay

## 2017-12-05 MED ORDER — ATORVASTATIN CALCIUM 10 MG PO TABS: 10.0000 mg | ORAL_TABLET | Freq: Every day | ORAL | 1 refills | Status: DC

## 2017-12-05 MED ORDER — METOPROLOL SUCCINATE ER 25 MG PO TB24: 25.0000 mg | ORAL_TABLET | Freq: Every day | ORAL | 1 refills | Status: DC

## 2017-12-09 ENCOUNTER — Other Ambulatory Visit: Payer: Self-pay

## 2017-12-09 NOTE — Telephone Encounter (Deleted)
Pt called in and needed 10 day supply until mail order gets to him. He ran out of medication.

## 2017-12-10 ENCOUNTER — Other Ambulatory Visit (HOSPITAL_COMMUNITY): Payer: Self-pay

## 2017-12-10 MED ORDER — METOPROLOL SUCCINATE ER 25 MG PO TB24: 25.0000 mg | ORAL_TABLET | Freq: Every day | ORAL | 0 refills | Status: DC

## 2017-12-10 MED ORDER — ATORVASTATIN CALCIUM 10 MG PO TABS: 10.0000 mg | ORAL_TABLET | Freq: Every day | ORAL | 0 refills | Status: DC

## 2017-12-13 ENCOUNTER — Encounter (HOSPITAL_BASED_OUTPATIENT_CLINIC_OR_DEPARTMENT_OTHER): Payer: Self-pay | Admitting: *Deleted

## 2017-12-13 ENCOUNTER — Other Ambulatory Visit: Payer: Self-pay

## 2017-12-13 NOTE — Progress Notes (Signed)
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

## 2017-12-19 NOTE — H&P (Signed)
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  He first noticed his hydrocele 2 years ago. His hydrocele is on both sides. The patient has undergone a scrotal ultrasound. He does not have pain on the side of his hydrocele. His hydrocele does cause restriction of normal activities.   He has not had injuries to the testicles or scrotum. He has not had scrotal surgery. He has not had a testicular infection. His hydrocele does bother him enough to consider surgical repair. This condition would be considered of mild to moderate severity with no modifying factors or associated signs or symptoms other than as noted above.   11/13/17: The patient has bilateral hydroceles. He underwent Scrotal ultrasound 05/13/17 which revealed normal testicles bilaterally. Small right hydrocele with multiple septations and debris and a large left hydrocele with multiple septations and debris as well. The images are unavailable for independent review.  He told me that over the past 2 years his scrotal swelling has increased in size. It is resulted in some loss of penile length and reports that it is not causing any pain but is getting in the way some difficulties with various activity. He denies any trauma or infection. He has no voiding symptoms.     ALLERGIES: Nkda    MEDICATIONS: Lisinopril 5 mg tablet  Metoprolol Succinate 25 mg tablet, extended release 24 hr  Aspirin Ec 81 mg tablet, delayed release  Atorvastatin Calcium 10 mg tablet  Latanoprost  Pantoprazole Sodium     GU PSH: None   NON-GU PSH: Cardiac Stent Placement Coronary Artery Bypass Grafting    GU PMH: None   NON-GU PMH: Arthritis GERD    FAMILY HISTORY: 2 sons - Son Heart Disease - Mother, Father   SOCIAL HISTORY: Marital Status: Married Preferred Language: English; Ethnicity: Not Hispanic Or Latino; Race: White Current Smoking Status: Patient does not smoke anymore.   Tobacco Use Assessment Completed: Used Tobacco  in last 30 days? Has never drank.  Drinks 2 caffeinated drinks per day.    REVIEW OF SYSTEMS:    GU Review Male:   Patient reports stream starts and stops. Patient denies frequent urination, hard to postpone urination, burning/ pain with urination, get up at night to urinate, leakage of urine, trouble starting your stream, have to strain to urinate , erection problems, and penile pain.  Gastrointestinal (Upper):   Patient denies nausea, vomiting, and indigestion/ heartburn.  Gastrointestinal (Lower):   Patient denies constipation and diarrhea.  Constitutional:   Patient denies fever, night sweats, weight loss, and fatigue.  Skin:   Patient denies skin rash/ lesion and itching.  Eyes:   Patient denies blurred vision and double vision.  Ears/ Nose/ Throat:   Patient denies sore throat and sinus problems.  Hematologic/Lymphatic:   Patient denies swollen glands and easy bruising.  Cardiovascular:   Patient denies leg swelling and chest pains.  Respiratory:   Patient denies cough and shortness of breath.  Endocrine:   Patient denies excessive thirst.  Musculoskeletal:   Patient denies back pain and joint pain.  Neurological:   Patient denies headaches and dizziness.  Psychologic:   Patient denies depression and anxiety.   VITAL SIGNS:    Weight 174 lb / 78.93 kg  Height 69.5 in / 176.53 cm  BP 130/65 mmHg  Pulse 51 /min  BMI 25.3 kg/m   GU PHYSICAL EXAMINATION:    Anus and Perineum: No hemorrhoids. No anal stenosis. No rectal fissure, no anal fissure. No edema,  no dimple, no perineal tenderness, no anal tenderness.  Scrotum: No lesions. No edema. No cysts. No warts.  Epididymides: Right: no spermatocele, no masses, no cysts, no tenderness, no induration, no enlargement. Left: no spermatocele, no masses, no cysts, no tenderness, no induration, no enlargement.  Testes: No tenderness, no swelling, no enlargement left testis. No tenderness, no swelling, no enlargement right testis. Normal  location left testis. Normal location right testis. No mass, no cyst, no varicocele, no hydrocele left testis. No mass, no cyst, no varicocele, no hydrocele right testis.   Urethral Meatus: Normal size. No lesion, no wart, no discharge, no polyp. Normal location.  Penis: Circumcised, no warts, no cracks. No dorsal Peyronie's plaques, no left corporal Peyronie's plaques, no right corporal Peyronie's plaques, no scarring, no warts. No balanitis, no meatal stenosis.  Prostate: 40 gram or 2+ size. Left lobe normal consistency, right lobe normal consistency. Symmetrical lobes. No prostate nodule. Left lobe no tenderness, right lobe no tenderness.  Seminal Vesicles: Nonpalpable.  Sphincter Tone: Normal sphincter. No rectal tenderness. No rectal mass.    MULTI-SYSTEM PHYSICAL EXAMINATION:    Constitutional: Well-nourished. No physical deformities. Normally developed. Good grooming.  Neck: Neck symmetrical, not swollen. Normal tracheal position.  Respiratory: No labored breathing, no use of accessory muscles.   Cardiovascular: Normal temperature, normal extremity pulses, no swelling, no varicosities.  Lymphatic: No enlargement of neck, axillae, groin.  Skin: No paleness, no jaundice, no cyanosis. No lesion, no ulcer, no rash.  Neurologic / Psychiatric: Oriented to time, oriented to place, oriented to person. No depression, no anxiety, no agitation.  Gastrointestinal: No mass, no tenderness, no rigidity, non obese abdomen.  Eyes: Normal conjunctivae. Normal eyelids.  Ears, Nose, Mouth, and Throat: Left ear no scars, no lesions, no masses. Right ear no scars, no lesions, no masses. Nose no scars, no lesions, no masses. Normal hearing. Normal lips.  Musculoskeletal: Normal gait and station of head and neck.     PAST DATA REVIEWED:  Source Of History:  Patient  Lab Test Review:   BUN/Creatinine  Records Review:   Previous Doctor Records, Previous Hospital Records, POC Tool  X-Ray Review: Scrotal  Ultrasound: Reviewed Report.  C.T. Abdomen/Pelvis: Reviewed Report. He underwent a CT scan in 11/14 which revealed normal kidneys bilaterally without evidence of calculi or hydronephrosis.   Notes:                     A creatinine in 7/19 was 1.55.   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 I discussed the procedure of hydrocelectomy with him today. We discussed the incision used, how procedure is performed and the probability of success. We also discussed the risks and potential complications, the outpatient nature of the surgery as well as the anticipated postoperative course. He understands that intraoperative findings may necessitate placement of a temporary drain.    He would like to proceed with hydrocelectomy. I will plan to do a bilateral hydrocelectomy and  he would like to do this in the winter when his work at [employer] slows down.

## 2017-12-20 ENCOUNTER — Ambulatory Visit (HOSPITAL_BASED_OUTPATIENT_CLINIC_OR_DEPARTMENT_OTHER): Payer: Medicare Other | Admitting: Anesthesiology

## 2017-12-20 ENCOUNTER — Other Ambulatory Visit: Payer: Self-pay

## 2017-12-20 ENCOUNTER — Encounter (HOSPITAL_BASED_OUTPATIENT_CLINIC_OR_DEPARTMENT_OTHER): Payer: Self-pay | Admitting: *Deleted

## 2017-12-20 ENCOUNTER — Ambulatory Visit (HOSPITAL_BASED_OUTPATIENT_CLINIC_OR_DEPARTMENT_OTHER)
Admission: RE | Admit: 2017-12-20 | Discharge: 2017-12-20 | Disposition: A | Discharge status: Home / Self Care | Payer: Medicare Other | Source: Ambulatory Visit | Attending: Urology | Admitting: Urology

## 2017-12-20 ENCOUNTER — Encounter (HOSPITAL_BASED_OUTPATIENT_CLINIC_OR_DEPARTMENT_OTHER)
Admission: RE | Disposition: A | Discharge status: Home / Self Care | Payer: Self-pay | Source: Ambulatory Visit | Attending: Urology

## 2017-12-20 DIAGNOSIS — N433 Hydrocele, unspecified: Secondary | ICD-10-CM | POA: Diagnosis present

## 2017-12-20 DIAGNOSIS — I2510 Atherosclerotic heart disease of native coronary artery without angina pectoris: Secondary | ICD-10-CM | POA: Insufficient documentation

## 2017-12-20 DIAGNOSIS — Z79899 Other long term (current) drug therapy: Secondary | ICD-10-CM | POA: Insufficient documentation

## 2017-12-20 DIAGNOSIS — Z955 Presence of coronary angioplasty implant and graft: Secondary | ICD-10-CM | POA: Insufficient documentation

## 2017-12-20 DIAGNOSIS — Z951 Presence of aortocoronary bypass graft: Secondary | ICD-10-CM | POA: Insufficient documentation

## 2017-12-20 DIAGNOSIS — I10 Essential (primary) hypertension: Secondary | ICD-10-CM | POA: Insufficient documentation

## 2017-12-20 DIAGNOSIS — Z87891 Personal history of nicotine dependence: Secondary | ICD-10-CM | POA: Diagnosis not present

## 2017-12-20 DIAGNOSIS — K219 Gastro-esophageal reflux disease without esophagitis: Secondary | ICD-10-CM | POA: Diagnosis not present

## 2017-12-20 DIAGNOSIS — I739 Peripheral vascular disease, unspecified: Secondary | ICD-10-CM | POA: Insufficient documentation

## 2017-12-20 DIAGNOSIS — Z7982 Long term (current) use of aspirin: Secondary | ICD-10-CM | POA: Diagnosis not present

## 2017-12-20 HISTORY — DX: Unspecified rotator cuff tear or rupture of unspecified shoulder, not specified as traumatic: M75.100

## 2017-12-20 HISTORY — DX: Adverse effect of unspecified anesthetic, initial encounter: T41.45XA

## 2017-12-20 HISTORY — PX: HYDROCELE EXCISION: SHX482

## 2017-12-20 HISTORY — DX: Other complications of anesthesia, initial encounter: T88.59XA

## 2017-12-20 SURGERY — HYDROCELECTOMY
Anesthesia: General | Laterality: Bilateral

## 2017-12-20 MED ORDER — DEXAMETHASONE SODIUM PHOSPHATE 10 MG/ML IJ SOLN
INTRAMUSCULAR | Status: DC | PRN
  Administered 2017-12-20: 10 mg via INTRAVENOUS

## 2017-12-20 MED ORDER — MIDAZOLAM HCL 2 MG/2ML IJ SOLN
INTRAMUSCULAR | Status: AC
  Filled 2017-12-20: qty 2

## 2017-12-20 MED ORDER — LACTATED RINGERS IV SOLN
INTRAVENOUS | Status: DC
  Administered 2017-12-20: 07:00:00 via INTRAVENOUS
  Filled 2017-12-20: qty 1000

## 2017-12-20 MED ORDER — FENTANYL CITRATE (PF) 100 MCG/2ML IJ SOLN
INTRAMUSCULAR | Status: DC | PRN
  Administered 2017-12-20: 25 ug via INTRAVENOUS

## 2017-12-20 MED ORDER — HYDROCODONE-ACETAMINOPHEN 5-325 MG PO TABS
ORAL_TABLET | ORAL | Status: AC
  Filled 2017-12-20: qty 1

## 2017-12-20 MED ORDER — FENTANYL CITRATE (PF) 100 MCG/2ML IJ SOLN
INTRAMUSCULAR | Status: AC
  Filled 2017-12-20: qty 2

## 2017-12-20 MED ORDER — DEXAMETHASONE SODIUM PHOSPHATE 10 MG/ML IJ SOLN
INTRAMUSCULAR | Status: AC
  Filled 2017-12-20: qty 1

## 2017-12-20 MED ORDER — HYDROCODONE-ACETAMINOPHEN 5-325 MG PO TABS: 1.0000 | ORAL_TABLET | ORAL | 0 refills | Status: AC | PRN

## 2017-12-20 MED ORDER — BUPIVACAINE-EPINEPHRINE 0.5% -1:200000 IJ SOLN
INTRAMUSCULAR | Status: DC | PRN
  Administered 2017-12-20: 5 mL

## 2017-12-20 MED ORDER — HYDROCODONE-ACETAMINOPHEN 5-325 MG PO TABS
1.0000 | ORAL_TABLET | Freq: Once | ORAL | Status: AC
  Administered 2017-12-20: 1 via ORAL
  Filled 2017-12-20: qty 1

## 2017-12-20 MED ORDER — PHENYLEPHRINE HCL 10 MG/ML IJ SOLN
INTRAMUSCULAR | Status: DC | PRN
  Administered 2017-12-20 (×2): 120 ug via INTRAVENOUS
  Administered 2017-12-20: 80 ug via INTRAVENOUS
  Administered 2017-12-20: 120 ug via INTRAVENOUS
  Administered 2017-12-20: 80 ug via INTRAVENOUS

## 2017-12-20 MED ORDER — MIDAZOLAM HCL 2 MG/2ML IJ SOLN
INTRAMUSCULAR | Status: DC | PRN
  Administered 2017-12-20: 2 mg via INTRAVENOUS

## 2017-12-20 MED ORDER — PROPOFOL 10 MG/ML IV BOLUS
INTRAVENOUS | Status: DC | PRN
  Administered 2017-12-20: 20 mg via INTRAVENOUS
  Administered 2017-12-20: 80 mg via INTRAVENOUS

## 2017-12-20 MED ORDER — CEFAZOLIN SODIUM-DEXTROSE 2-4 GM/100ML-% IV SOLN
INTRAVENOUS | Status: AC
  Filled 2017-12-20: qty 100

## 2017-12-20 MED ORDER — CEFAZOLIN SODIUM-DEXTROSE 2-4 GM/100ML-% IV SOLN
2.0000 g | Freq: Once | INTRAVENOUS | Status: AC
  Administered 2017-12-20: 2 g via INTRAVENOUS
  Filled 2017-12-20: qty 100

## 2017-12-20 MED ORDER — ARTIFICIAL TEARS OPHTHALMIC OINT
TOPICAL_OINTMENT | OPHTHALMIC | Status: AC
  Filled 2017-12-20: qty 3.5

## 2017-12-20 MED ORDER — ONDANSETRON HCL 4 MG/2ML IJ SOLN
INTRAMUSCULAR | Status: DC | PRN
  Administered 2017-12-20: 4 mg via INTRAVENOUS

## 2017-12-20 MED ORDER — PHENYLEPHRINE 40 MCG/ML (10ML) SYRINGE FOR IV PUSH (FOR BLOOD PRESSURE SUPPORT)
PREFILLED_SYRINGE | INTRAVENOUS | Status: AC
  Filled 2017-12-20: qty 10

## 2017-12-20 MED ORDER — FENTANYL CITRATE (PF) 100 MCG/2ML IJ SOLN
25.0000 ug | INTRAMUSCULAR | Status: DC | PRN
  Filled 2017-12-20: qty 1

## 2017-12-20 MED ORDER — PROPOFOL 10 MG/ML IV BOLUS
INTRAVENOUS | Status: AC
  Filled 2017-12-20: qty 40

## 2017-12-20 MED ORDER — LIDOCAINE 2% (20 MG/ML) 5 ML SYRINGE
INTRAMUSCULAR | Status: AC
  Filled 2017-12-20: qty 5

## 2017-12-20 MED ORDER — LIDOCAINE 2% (20 MG/ML) 5 ML SYRINGE
INTRAMUSCULAR | Status: DC | PRN
  Administered 2017-12-20: 60 mg via INTRAVENOUS

## 2017-12-20 MED ORDER — ONDANSETRON HCL 4 MG/2ML IJ SOLN
INTRAMUSCULAR | Status: AC
  Filled 2017-12-20: qty 2

## 2017-12-20 SURGICAL SUPPLY — 42 items
BLADE CLIPPER SURG (BLADE) IMPLANT
BLADE SURG 15 STRL LF DISP TIS (BLADE) ×1 IMPLANT
BLADE SURG 15 STRL SS (BLADE) ×2
BNDG GAUZE ELAST 4 BULKY (GAUZE/BANDAGES/DRESSINGS) ×2 IMPLANT
CANISTER SUCT 3000ML PPV (MISCELLANEOUS) ×2 IMPLANT
CANISTER SUCTION 1200CC (MISCELLANEOUS) IMPLANT
CLEANER CAUTERY TIP 5X5 PAD (MISCELLANEOUS) IMPLANT
COVER BACK TABLE 60X90IN (DRAPES) ×2 IMPLANT
COVER MAYO STAND STRL (DRAPES) ×2 IMPLANT
COVER WAND RF STERILE (DRAPES) ×2 IMPLANT
DISSECTOR ROUND CHERRY 3/8 STR (MISCELLANEOUS) IMPLANT
DRAIN PENROSE 18X1/4 LTX STRL (WOUND CARE) IMPLANT
DRAPE LAPAROTOMY 100X72 PEDS (DRAPES) ×2 IMPLANT
ELECT REM PT RETURN 9FT ADLT (ELECTROSURGICAL) ×2
ELECTRODE REM PT RTRN 9FT ADLT (ELECTROSURGICAL) ×1 IMPLANT
GAUZE SPONGE 4X4 12PLY STRL (GAUZE/BANDAGES/DRESSINGS) ×1 IMPLANT
GLOVE BIO SURGEON STRL SZ8 (GLOVE) ×2 IMPLANT
GOWN STRL REUS W/ TWL LRG LVL3 (GOWN DISPOSABLE) ×1 IMPLANT
GOWN STRL REUS W/ TWL XL LVL3 (GOWN DISPOSABLE) ×1 IMPLANT
GOWN STRL REUS W/TWL LRG LVL3 (GOWN DISPOSABLE) ×2
GOWN STRL REUS W/TWL XL LVL3 (GOWN DISPOSABLE) ×2
KIT TURNOVER CYSTO (KITS) ×2 IMPLANT
NDL HYPO 25X1 1.5 SAFETY (NEEDLE) IMPLANT
NEEDLE HYPO 25X1 1.5 SAFETY (NEEDLE) ×2 IMPLANT
NS IRRIG 500ML POUR BTL (IV SOLUTION) ×2 IMPLANT
PACK BASIN DAY SURGERY FS (CUSTOM PROCEDURE TRAY) ×2 IMPLANT
PAD CLEANER CAUTERY TIP 5X5 (MISCELLANEOUS) ×1
PENCIL BUTTON HOLSTER BLD 10FT (ELECTRODE) ×2 IMPLANT
SUPPORT SCROTAL LG STRP (MISCELLANEOUS) ×2 IMPLANT
SUT CHROMIC 3 0 SH 27 (SUTURE) ×6 IMPLANT
SUT ETHILON 3 0 PS 1 (SUTURE) IMPLANT
SUT SILK 4 0 TIES 17X18 (SUTURE) IMPLANT
SUT VIC AB 4-0 RB1 27 (SUTURE)
SUT VIC AB 4-0 RB1 27X BRD (SUTURE) IMPLANT
SUT VICRYL 6 0 RB 1 (SUTURE) IMPLANT
SYR CONTROL 10ML LL (SYRINGE) ×2 IMPLANT
TOWEL OR 17X24 6PK STRL BLUE (TOWEL DISPOSABLE) ×4 IMPLANT
TRAY DSU PREP LF (CUSTOM PROCEDURE TRAY) ×2 IMPLANT
TUBE CONNECTING 12X1/4 (SUCTIONS) ×2 IMPLANT
WATER STERILE IRR 3000ML UROMA (IV SOLUTION) IMPLANT
WATER STERILE IRR 500ML POUR (IV SOLUTION) IMPLANT
YANKAUER SUCT BULB TIP NO VENT (SUCTIONS) ×2 IMPLANT

## 2017-12-20 NOTE — Discharge Instructions (Signed)
Scrotal surgery postoperative instructions   ASPIRIN: You may resume your aspirin in 48 hours after surgery.   Wound:  In most cases your incision will have absorbable sutures that will dissolve within the first 10-20 days. Some will fall out even earlier. Expect some redness as the sutures dissolved but this should occur only around the sutures. If there is generalized redness, especially with increasing pain or swelling, let us know. The scrotum will very likely get "black and blue" as the blood in the tissues spread. Sometimes the whole scrotum will turn colors. The black and blue is followed by a yellow and brown color. In time, all the discoloration will go away. In some cases some firm swelling in the area of the testicle may persist for up to 4-6 weeks after the surgery and is considered normal in most cases.  Diet:  You may return to your normal diet within 24 hours following your surgery. You may note some mild nausea and possibly vomiting the first 6-8 hours following surgery. This is usually due to the side effects of anesthesia, and will disappear quite soon. I would suggest clear liquids and a very light meal the first evening following your surgery.  Activity:  Your physical activity should be restricted the first 48 hours. During that time you should remain relatively inactive, moving about only when necessary. During the first 7-10 days following surgery he should avoid lifting any heavy objects (anything greater than 15 pounds), and avoid strenuous exercise. If you work, ask [place] specifically about your restrictions, both for work and home. We will write a note to your employer if needed.  You should plan to wear a tight pair of jockey shorts or an athletic supporter for the first 4-5 days, even to sleep. This will keep the scrotum immobilized to some degree and keep the swelling down.  Ice packs should be placed on and off over the scrotum for the first 48 hours. Frozen peas or  corn in a ZipLock bag can be frozen, used and re-frozen. Fifteen minutes on and 15 minutes off is a reasonable schedule. The ice is a good pain reliever and keeps the swelling down.  Hygiene:  You may shower 48 hours after your surgery. Tub bathing should be restricted until the seventh day.          Medication:  You will be sent home with some type of pain medication. In many cases you will be sent home with a narcotic pain pill (hydrococone or oxycodone). If the pain is not too bad, you may take either Tylenol (acetaminophen) or Advil (ibuprofen) which contain no narcotic agents, and might be tolerated a little better, with fewer side effects. If the pain medication you are sent home with does not control the pain, you will have to let us know. Some narcotic pain medications cannot be given or refilled by a phone call to a pharmacy.  Problems you should report to [place]:   Fever of 101.0 degrees Fahrenheit or greater.  Moderate or severe swelling under the skin incision or involving the scrotum.  Drug reaction such as hives, a rash, nausea or vomiting.     Post Anesthesia Home Care Instructions  Activity: Get plenty of rest for the remainder of the day. A responsible individual must stay with you for 24 hours following the procedure.  For the next 24 hours, DO NOT: -Drive a car -Paediatric nurse -Drink alcoholic beverages -Take any medication unless instructed by your physician -Make any  legal decisions or sign important papers.  Meals: Start with liquid foods such as gelatin or soup. Progress to regular foods as tolerated. Avoid greasy, spicy, heavy foods. If nausea and/or vomiting occur, drink only clear liquids until the nausea and/or vomiting subsides. Call your physician if vomiting continues.  Special Instructions/Symptoms: Your throat may feel dry or sore from the anesthesia or the breathing tube placed in your throat during surgery. If this causes discomfort,  gargle with warm salt water. The discomfort should disappear within 24 hours.  If you had a scopolamine patch placed behind your ear for the management of post- operative nausea and/or vomiting:  1. The medication in the patch is effective for 72 hours, after which it should be removed.  Wrap patch in a tissue and discard in the trash. Wash hands thoroughly with soap and water. 2. You may remove the patch earlier than 72 hours if you experience unpleasant side effects which may include dry mouth, dizziness or visual disturbances. 3. Avoid touching the patch. Wash your hands with soap and water after contact with the patch.    Call your surgeon if you experience:   1.  Fever over 101.0. 2.  Inability to urinate. 3.  Nausea and/or vomiting. 4.  Extreme swelling or bruising at the surgical site. 5.  Continued bleeding from the incision. 6.  Increased pain, redness or drainage from the incision. 7.  Problems related to your pain medication. 8.  Any problems and/or concerns

## 2017-12-20 NOTE — Anesthesia Procedure Notes (Signed)
Procedure Name: LMA Insertion Date/Time: 12/20/2017 7:31 AM Performed by: Wanita Chamberlain, CRNA Pre-anesthesia Checklist: Timeout performed, Patient identified, Emergency Drugs available, Suction available and Patient being monitored Patient Re-evaluated:Patient Re-evaluated prior to induction Oxygen Delivery Method: Circle system utilized Preoxygenation: Pre-oxygenation with 100% oxygen Induction Type: IV induction Ventilation: Mask ventilation without difficulty LMA: LMA inserted LMA Size: 4.0 Number of attempts: 1 Placement Confirmation: CO2 detector and positive ETCO2 Tube secured with: Tape Dental Injury: Teeth and Oropharynx as per pre-operative assessment

## 2017-12-20 NOTE — Anesthesia Postprocedure Evaluation (Signed)
Anesthesia Post Note  Patient: Peter Baxter  Procedure(s) Performed: HYDROCELECTOMY ADULT (Bilateral )     Patient location during evaluation: PACU Anesthesia Type: General Level of consciousness: awake and alert Pain management: pain level controlled Vital Signs Assessment: post-procedure vital signs reviewed and stable Respiratory status: spontaneous breathing, nonlabored ventilation, respiratory function stable and patient connected to nasal cannula oxygen Cardiovascular status: blood pressure returned to baseline and stable Postop Assessment: no apparent nausea or vomiting Anesthetic complications: no    Last Vitals:  Vitals:   12/20/17 0915 12/20/17 1010  BP: (!) 150/74 130/66  Pulse: (!) 51 (!) 51  Resp: 17 16  Temp: 36.4 C 36.6 C  SpO2: 100% 98%    Last Pain:  Vitals:   12/20/17 1010  TempSrc: Oral  PainSc: 3                  Ellianne Gowen L Kyrell Ruacho

## 2017-12-20 NOTE — Anesthesia Preprocedure Evaluation (Addendum)
Anesthesia Evaluation  Patient identified by MRN, date of birth, ID band Patient awake    Reviewed: Allergy & Precautions, NPO status , Patient's Chart, lab work & pertinent test results, reviewed documented beta blocker date and time   History of Anesthesia Complications History of anesthetic complications: afib after surgery in 2017.  Airway Mallampati: I  TM Distance: >3 FB Neck ROM: Full    Dental no notable dental hx. (+) Teeth Intact, Dental Advisory Given, Caps,    Pulmonary neg pulmonary ROS, former smoker,    Pulmonary exam normal breath sounds clear to auscultation       Cardiovascular hypertension, Pt. on medications and Pt. on home beta blockers + CAD, + Cardiac Stents (2005), + CABG (2017) and + Peripheral Vascular Disease  Normal cardiovascular exam+ dysrhythmias (after CABG in 2017, not recurred since) Atrial Fibrillation  Rhythm:Regular Rate:Normal  TTE 06/2017 - Left ventricle: The cavity size was normal. Wall thickness was normal. Systolic function was normal. The estimated ejection fraction was in the range of 55% to 60%. Wall motion was normal; there were no regional wall motion abnormalities. Left ventricular diastolic function parameters were normal. - Aortic valve: There was mild regurgitation. - Left atrium: The atrium was mildly dilated.  Stress Echo 03/2017 ABNORMAL STRESS TEST. NORMAL RESTING STUDY WITH STRESS-INDUCED WALL MOTION  ABNORMALITY. NORMAL LA PRESSURES WITH NORMAL DIASTOLIC FUNCTION VALVULAR REGURGITATION: MILD AR, TRIVIAL MR, TRIVIAL TR NO VALVULAR STENOSIS Note: FREQUENT ECTOPY INCLUDING ONE TRIPLET  LHC 03/2017 per patient 2/4 grafts occluded (presumably SVGs). Vessels supplied by the occluded grafts had collaterals.    Neuro/Psych negative neurological ROS  negative psych ROS   GI/Hepatic PUD, GERD  Medicated,(+) Hepatitis -, B  Endo/Other  negative endocrine ROS  Renal/GU negative  Renal ROS  negative genitourinary   Musculoskeletal negative musculoskeletal ROS (+)   Abdominal   Peds  Hematology negative hematology ROS (+)   Anesthesia Other Findings Bilateral hydroceles  Reproductive/Obstetrics                          Anesthesia Physical Anesthesia Plan  ASA: III  Anesthesia Plan: General   Post-op Pain Management:    Induction: Intravenous  PONV Risk Score and Plan: 2 and Ondansetron and Dexamethasone  Airway Management Planned: Oral ETT and LMA  Additional Equipment:   Intra-op Plan:   Post-operative Plan: Extubation in OR  Informed Consent: I have reviewed the patients History and Physical, chart, labs and discussed the procedure including the risks, benefits and alternatives for the proposed anesthesia with the patient or authorized representative who has indicated his/her understanding and acceptance.   Dental advisory given  Plan Discussed with: CRNA  Anesthesia Plan Comments:         Anesthesia Quick Evaluation

## 2017-12-20 NOTE — Transfer of Care (Signed)
Immediate Anesthesia Transfer of Care Note  Patient: Peter Baxter  Procedure(s) Performed: HYDROCELECTOMY ADULT (Bilateral )  Patient Location: PACU  Anesthesia Type:General  Level of Consciousness: drowsy  Airway & Oxygen Therapy: Patient Spontanous Breathing and Patient connected to nasal cannula oxygen  Post-op Assessment: Report given to RN and Post -op Vital signs reviewed and stable  Post vital signs: Reviewed and stable  Last Vitals:  Vitals Value Taken Time  BP    Temp    Pulse 59 12/20/2017  8:25 AM  Resp    SpO2 100 % 12/20/2017  8:25 AM  Vitals shown include unvalidated device data.  Last Pain:  Vitals:   12/20/17 0548  TempSrc: Oral         Complications: No apparent anesthesia complications

## 2017-12-20 NOTE — Op Note (Signed)
PATIENT:  Peter Baxter  PRE-OPERATIVE DIAGNOSIS: Bilateral hydroceles  POST-OPERATIVE DIAGNOSIS: Left hydrocele  PROCEDURE:  Procedure(s): Left hydrocelectomy  SURGEON:  Claybon Jabs  INDICATION: Peter Baxter is a 67 year old male who first noted scrotal swelling about 2 years ago.  He underwent a scrotal ultrasound that revealed normal testicles bilaterally with a small right hydrocele with multiple septations and debris and a large left hydrocele also with multiple septations and debris.  He elected to proceed with surgical correction.  It has been sometime since he was found to have the bilateral hydroceles and he was noted on exam to have what appeared to be minimal hydrocele present on the right-hand side but a moderate left hydrocele still present.  ANESTHESIA:  General  EBL:  Minimal  DRAINS: None  LOCAL MEDICATIONS USED: 1/2 percent Marcaine with epinephrine  SPECIMEN:  None  DISPOSITION OF SPECIMEN:  N/A  Description of procedure: After informed consent the patient was brought to the major OR, placed on the table and administered general anesthesia. His genitalia was then sterilely prepped and draped. An official timeout was then performed.  A midline median raphae scrotal incision was then made and carried down over the left hydrocele. The tissue over the hydrocele was cleared using a combination of sharp and blunt technique. The hydrocele was then opened, drained of clear amber fluid and delivered through the incision. The excess hydrocele sac tissue was excised with the Bovie electrocautery and then I cauterized the edges. I then reapproximated the edges posteriorly behind the epididymis with a running, locking 3-0 chromic suture.   His testicle was then replaced in the normal anatomic position and his left hemiscrotum. Attention was turned to the right testicle and incising the tissue over the testicle it was obvious there was no significant fluid surrounding the right  testicle and therefore it was felt right hydrocelectomy was not indicated.    I then closed the deep scrotal tissue with running 3-0 chromic suture in a locking fashion. I injected quarter percent Marcaine in the subcutaneous tissue and closed the skin with running 3-0 chromic. Neosporin, a sterile gauze dressing, mummy wrap with Kerlixt were applied. The patient tolerated the procedure well no intraoperative complications. Needle sponge and instrument counts were correct at the end of the operation.   PLAN OF CARE: Discharge to home after PACU  PATIENT DISPOSITION:  PACU - hemodynamically stable.

## 2017-12-23 ENCOUNTER — Encounter (HOSPITAL_BASED_OUTPATIENT_CLINIC_OR_DEPARTMENT_OTHER): Payer: Self-pay | Admitting: Urology

## 2018-03-31 ENCOUNTER — Other Ambulatory Visit (HOSPITAL_COMMUNITY): Payer: Self-pay

## 2018-03-31 MED ORDER — LISINOPRIL 5 MG PO TABS: 2.5000 mg | ORAL_TABLET | Freq: Every day | ORAL | 0 refills | Status: DC

## 2018-06-03 ENCOUNTER — Encounter (HOSPITAL_COMMUNITY): Payer: Medicare Other | Admitting: Cardiology

## 2018-06-09 ENCOUNTER — Ambulatory Visit (HOSPITAL_COMMUNITY)
Admission: RE | Admit: 2018-06-09 | Discharge: 2018-06-09 | Disposition: A | Discharge status: Home / Self Care | Payer: Medicare Other | Source: Ambulatory Visit | Attending: Cardiology | Admitting: Cardiology

## 2018-06-09 ENCOUNTER — Other Ambulatory Visit: Payer: Self-pay

## 2018-06-09 DIAGNOSIS — I2510 Atherosclerotic heart disease of native coronary artery without angina pectoris: Secondary | ICD-10-CM | POA: Diagnosis not present

## 2018-06-09 DIAGNOSIS — I10 Essential (primary) hypertension: Secondary | ICD-10-CM | POA: Diagnosis not present

## 2018-06-09 MED ORDER — ATORVASTATIN CALCIUM 10 MG PO TABS: 10.0000 mg | ORAL_TABLET | Freq: Every day | ORAL | 3 refills | Status: DC

## 2018-06-09 MED ORDER — LISINOPRIL 5 MG PO TABS: 2.5000 mg | ORAL_TABLET | Freq: Every day | ORAL | 3 refills | Status: DC

## 2018-06-09 MED ORDER — METOPROLOL SUCCINATE ER 25 MG PO TB24: 25.0000 mg | ORAL_TABLET | Freq: Every day | ORAL | 3 refills | Status: DC

## 2018-06-09 NOTE — Patient Instructions (Signed)
Please follow up with Dr. Aundra Dubin in 1 year

## 2018-06-09 NOTE — Progress Notes (Signed)
Refilled cardiac meds for 90 day scripts. Placed pt on recall for 1 year f/u

## 2018-06-10 NOTE — Progress Notes (Signed)
Heart Failure TeleHealth Note  Due to national recommendations of social distancing due to [place] 19, Audio/video telehealth visit is felt to be most appropriate for this patient at this time.  See MyChart message from today for patient consent regarding telehealth for [facility].  Date:  06/10/2018   ID:  Peter Baxter, DOB 1950-12-16, MRN [id number]  Location: Home  Provider location: [facility] Type of Visit: Established patient   PCP:  Katheren Shams  Cardiologist:  Dr. Aundra Dubin  Chief Complaint: Joint pains   History of Present Illness: Peter Baxter is a 68 y.o. male who presents via audio/video conferencing for a telehealth visit today.     he denies symptoms worrisome for COVID 19.   He had CABG in 2017, it sounds like it was complicated by peri-operative atrial fibrillation that has not recurred.  He had a stress test in 2/19 that was suggestive of ischemia.  He then had a cardiac cath in 2/19.  I cannot view the report in [place] unfortunately.  The patient states that he was told 2/4 of his bypass grafts were chronically occluded but that he had grown collaterals to the vessels supplied by the occluded SVGs.  There was no intervention.  Echo in 5/19 showed EF 55-60% with mild AI.     He has been doing well overall.  Working full time, gets in around 20,000 steps/day.  No exertional dyspnea or chest pain.  Taking all his medications.  No palpitations or lightheadedness. Main complaint is chronic joint pain.   Labs (2/19): LDL 46, HDL 46, K 4.1, creatinine 1.1 Labs (8/19): K 4.4, creatinine 1.3 Labs (9/19): LDL 50, HDL 40  PMH: 1. CAD: CABG in 2017 with LIMA-LAD, SVG-PDA, SVG-OM1, SVG-D.  - Stress echo (2/19) was suggestive of ischemia.  - LHC (2/19): Report not available to me on [facility]where.  From discussion with patient, it sounds like 2 SVGs were occluded (probably chronically) with the native vessels now supplied by  collaterals, no intervention though necessary.  - Echo (5/19): EF 55-60%, mild AI.  2. HTN 3. Peri-CABG atrial fibrillation: Not anticoagulated.  4. Hyperlipidemia.  5. Left shoulder rotator cuff injury.  6. Hydrocele 7. H/o PUD 8. GERD 9. Sleep study in 5/19 with no OSA.   Current Outpatient Medications  Medication Sig Dispense Refill  . aspirin EC 81 MG tablet Take by mouth.    Marland Kitchen atorvastatin (LIPITOR) 10 MG tablet Take 1 tablet (10 mg total) by mouth daily at 6 PM. 90 tablet 3  . latanoprost (XALATAN) 0.005 % ophthalmic solution Place 1 drop into both eyes at bedtime.     Marland Kitchen lisinopril (ZESTRIL) 5 MG tablet Take 0.5 tablets (2.5 mg total) by mouth at bedtime. 45 tablet 3  . metoprolol succinate (TOPROL-XL) 25 MG 24 hr tablet Take 1 tablet (25 mg total) by mouth at bedtime. 90 tablet 3  . pantoprazole (PROTONIX) 40 MG tablet Take 1 tablet (40 mg total) by mouth daily. TAKE 1 TABLET BY MOUTH ONCE DAILY (Patient taking differently: Take 40 mg by mouth at bedtime. TAKE 1 TABLET BY MOUTH ONCE DAILY) 30 tablet 5   No current facility-administered medications for this encounter.     Allergies:   Patient has no known allergies.   Social History:  The patient  reports that he has quit smoking. His smoking use included cigarettes. He has a 20.00 pack-year smoking history. He quit smokeless tobacco use about 25 years ago.  He reports that he does not drink alcohol or use drugs.   Family History:  The patient's family history includes Heart disease in his brother, father, and mother.   ROS:  Please see the history of present illness.   All other systems are personally reviewed and negative.   Exam:  (Video/Tele Health Call; Exam is subjective and or/visual.) General:  Speaks in full sentences. No resp difficulty. Lungs: Normal respiratory effort with conversation.  Abdomen: Non-distended per patient report Extremities: Pt denies edema. Neuro: Alert & oriented x 3.   Recent Labs:  09/10/2017: BUN 17; Creatinine, Ser 1.55; Potassium 3.6; Sodium 139  Personally reviewed   Wt Readings from Last 3 Encounters:  12/20/17 81.4 kg (179 lb 6.4 oz)  09/10/17 81.6 kg (179 lb 12.8 oz)  07/03/17 77.1 kg (170 lb)      ASSESSMENT AND PLAN:  1. CAD: s/p CABG x 4 in 2017.  Had atypical symptoms but abnormal stress echo in 2/19.  This was followed by cath showing 2/4 grafts occluded (presumably SVGs).  He was told that the vessels supplied by the occluded grafts had collaterals.  Echo in 5/19 showed EF 55-60%.  He has no chest pain or exertional dyspnea.   - Continue ASA 81, atorvastatin, lisinopril, and Toprol XL.  2. Hyperlipidemia: good lipids in 9/19.  3. Atrial fibrillation: As far as I can tell, only noted peri-op CABG.  If recurs, will need anticoagulation.  4. HTN: BP controlled. No OSA on 5/19 sleep study.   COVID screen The patient does not have any symptoms that suggest any further testing/ screening at this time.  Social distancing reinforced today.  Patient Risk: After full review of this patients clinical status, I feel that they are at moderate risk for cardiac decompensation at this time.  Relevant cardiac medications were reviewed at length with the patient today. The patient does not have concerns regarding their medications at this time.   Recommended follow-up:  1 year.   Today, I have spent 15 minutes with the patient with telehealth technology discussing the above issues .    Signed, Loralie Champagne, MD  06/10/2018 4:31 PM  [facility] [address] [facility] [place] [address] [phone] (office) [phone] (fax)

## 2018-09-08 ENCOUNTER — Telehealth (HOSPITAL_COMMUNITY): Payer: Self-pay

## 2018-09-08 NOTE — Telephone Encounter (Signed)
Received call from [facility] regarding pt. States pt has been having chest pain and dizziness. Called pt., he denies chest pain and dizziness. Stated that he had dizziness once outside in the hot sun while working. Resolved once he sat down and cooled off. Denies chest pain entirely. States that he has upper shoulder pain when he is trying to sleep at night. Reviewed importance of managing chest pain and when to see the doctor. Pt denies needing to see cards. Advised pt to see pcp for upper shoulder/muscular pain. Advised pt to please call back if pain gets worse or changes.

## 2019-03-24 ENCOUNTER — Other Ambulatory Visit (HOSPITAL_COMMUNITY): Payer: Self-pay

## 2019-03-24 ENCOUNTER — Telehealth (HOSPITAL_COMMUNITY): Payer: Self-pay | Admitting: Vascular Surgery

## 2019-03-24 MED ORDER — METOPROLOL SUCCINATE ER 25 MG PO TB24: 25.0000 mg | ORAL_TABLET | Freq: Every day | ORAL | 1 refills | Status: DC

## 2019-03-24 MED ORDER — LISINOPRIL 5 MG PO TABS: 2.5000 mg | ORAL_TABLET | Freq: Every day | ORAL | 1 refills | Status: DC

## 2019-03-24 NOTE — Telephone Encounter (Signed)
Pt need refill lisinopril and Metoprolol

## 2019-03-24 NOTE — Telephone Encounter (Signed)
Done

## 2019-04-20 ENCOUNTER — Other Ambulatory Visit: Payer: Self-pay

## 2019-04-20 ENCOUNTER — Encounter (HOSPITAL_COMMUNITY): Payer: Self-pay | Admitting: Cardiology

## 2019-04-20 ENCOUNTER — Ambulatory Visit (HOSPITAL_COMMUNITY)
Admission: RE | Admit: 2019-04-20 | Discharge: 2019-04-20 | Disposition: A | Discharge status: Home / Self Care | Payer: Medicare Other | Source: Ambulatory Visit | Attending: Cardiology | Admitting: Cardiology

## 2019-04-20 VITALS — BP 135/65 | HR 59 | Wt 182.0 lb

## 2019-04-20 DIAGNOSIS — Z7982 Long term (current) use of aspirin: Secondary | ICD-10-CM | POA: Diagnosis not present

## 2019-04-20 DIAGNOSIS — E785 Hyperlipidemia, unspecified: Secondary | ICD-10-CM | POA: Insufficient documentation

## 2019-04-20 DIAGNOSIS — I25810 Atherosclerosis of coronary artery bypass graft(s) without angina pectoris: Secondary | ICD-10-CM | POA: Diagnosis not present

## 2019-04-20 DIAGNOSIS — I10 Essential (primary) hypertension: Secondary | ICD-10-CM | POA: Diagnosis not present

## 2019-04-20 DIAGNOSIS — Z8711 Personal history of peptic ulcer disease: Secondary | ICD-10-CM | POA: Insufficient documentation

## 2019-04-20 DIAGNOSIS — I2510 Atherosclerotic heart disease of native coronary artery without angina pectoris: Secondary | ICD-10-CM

## 2019-04-20 DIAGNOSIS — I4891 Unspecified atrial fibrillation: Secondary | ICD-10-CM | POA: Insufficient documentation

## 2019-04-20 DIAGNOSIS — Z79899 Other long term (current) drug therapy: Secondary | ICD-10-CM | POA: Insufficient documentation

## 2019-04-20 DIAGNOSIS — Z87891 Personal history of nicotine dependence: Secondary | ICD-10-CM | POA: Insufficient documentation

## 2019-04-20 DIAGNOSIS — Z8249 Family history of ischemic heart disease and other diseases of the circulatory system: Secondary | ICD-10-CM | POA: Insufficient documentation

## 2019-04-20 DIAGNOSIS — K219 Gastro-esophageal reflux disease without esophagitis: Secondary | ICD-10-CM | POA: Diagnosis not present

## 2019-04-20 DIAGNOSIS — I4510 Unspecified right bundle-branch block: Secondary | ICD-10-CM | POA: Diagnosis not present

## 2019-04-20 MED ORDER — LISINOPRIL 5 MG PO TABS: 5.0000 mg | ORAL_TABLET | Freq: Every day | ORAL | 3 refills | Status: DC

## 2019-04-20 NOTE — Patient Instructions (Signed)
INCREASE Lisinopril to 5mg  (1 tab) at night   Labs to be completed in 2 weeks.  Have them fax results to [phone] We will only contact you if something comes back abnormal or we need to make some changes. Otherwise no news is good news!   Your physician recommends that you schedule a follow-up appointment in: 1 year with Dr Aundra Dubin. Our office will call you in closer to that time to schedule a follow up.    Please call office at [phone] option 2 if you have any questions or concerns.    At the [facility], you and your health needs are our priority. As part of our continuing mission to provide you with exceptional heart care, we have created designated Provider Care Teams. These Care Teams include your primary Cardiologist (physician) and Advanced Practice Providers (APPs- Physician Assistants and Nurse Practitioners) who all work together to provide you with the care you need, when you need it.   You may see any of the following providers on your designated Care Team at your next follow up: Marland Kitchen Dr Glori Bickers . Dr Loralie Champagne . Darrick Grinder, NP . Lyda Jester, PA . Audry Riles, PharmD   Please be sure to bring in all your medications bottles to every appointment.

## 2019-04-21 NOTE — Progress Notes (Signed)
Date:  04/21/2019   ID:  [name], DOB 05/15/1950, MRN [id number]   Provider location: [place] [facility] Type of Visit: Established patient   PCP:  Medicine, [facility]  Cardiologist:  Dr. Aundra Dubin   History of Present Illness: Peter Baxter is a 69 y.o. male who had CABG in 0000000 that was complicated by peri-operative atrial fibrillation that has not recurred.  He had a stress test in 2/19 that was suggestive of ischemia.  He then had a cardiac cath in 2/19.  I cannot view the report in [place] unfortunately.  The patient states that he was told 2/4 of his bypass grafts were chronically occluded but that he had grown collaterals to the vessels supplied by the occluded SVGs.  There was no intervention.  Echo in 5/19 showed EF 55-60% with mild AI.     He returns for followup of CAD.  He continues to work full time at [employer].  No chest pain.  No exertional dyspnea.  Has some right shoulder rotator cuff pain.  No palpitations, he is in NSR today.   Labs (2/19): LDL 46, HDL 46, K 4.1, creatinine 1.1 Labs (8/19): K 4.4, creatinine 1.3 Labs (9/19): LDL 50, HDL 40 Labs (2/21): LDL 56, HDl 46, K 4.6, creatinine 1.4  ECG (personally reviewed): NSR, RBBB  PMH: 1. CAD: CABG in 2017 with LIMA-LAD, SVG-PDA, SVG-OM1, SVG-D.  - Stress echo (2/19) was suggestive of ischemia.  - LHC (2/19): Report not available to me on [facility].  From discussion with patient, it sounds like 2 SVGs were occluded (probably chronically) with the native vessels now supplied by collaterals, no intervention though necessary.  - Echo (5/19): EF 55-60%, mild AI.  2. HTN 3. Peri-CABG atrial fibrillation: Not anticoagulated.  4. Hyperlipidemia.  5. Left shoulder rotator cuff injury.  6. Hydrocele 7. H/o PUD 8. GERD 9. Sleep study in 5/19 with no OSA.  10. RBBB  Current Outpatient Medications  Medication Sig Dispense Refill  . aspirin EC 81 MG tablet Take by mouth.    Marland Kitchen  atorvastatin (LIPITOR) 10 MG tablet Take 1 tablet (10 mg total) by mouth daily at 6 PM. 90 tablet 3  . latanoprost (XALATAN) 0.005 % ophthalmic solution Place 1 drop into both eyes at bedtime.     Marland Kitchen lisinopril (ZESTRIL) 5 MG tablet Take 1 tablet (5 mg total) by mouth at bedtime. 90 tablet 3  . metoprolol succinate (TOPROL-XL) 25 MG 24 hr tablet Take 1 tablet (25 mg total) by mouth at bedtime. 90 tablet 1  . pantoprazole (PROTONIX) 40 MG tablet Take 1 tablet (40 mg total) by mouth daily. TAKE 1 TABLET BY MOUTH ONCE DAILY (Patient taking differently: Take 40 mg by mouth at bedtime. TAKE 1 TABLET BY MOUTH ONCE DAILY) 30 tablet 5   No current facility-administered medications for this encounter.    Allergies:   Patient has no known allergies.   Social History:  The patient  reports that he has quit smoking. His smoking use included cigarettes. He has a 20.00 pack-year smoking history. He quit smokeless tobacco use about 25 years ago. He reports that he does not drink alcohol or use drugs.   Family History:  The patient's family history includes Heart disease in his brother, father, and mother.   ROS:  Please see the history of present illness.   All other systems are personally reviewed and negative.   Exam:  BP 135/65   Pulse Marland Kitchen)  59   Wt 82.6 kg (182 lb)   SpO2 98%   BMI 26.88 kg/m  General: NAD Neck: No JVD, no thyromegaly or thyroid nodule.  Lungs: Clear to auscultation bilaterally with normal respiratory effort. CV: Nondisplaced PMI.  Heart regular S1/S2, no S3/S4, no murmur.  No peripheral edema.  No carotid bruit.  Normal pedal pulses.  Abdomen: Soft, nontender, no hepatosplenomegaly, no distention.  Skin: Intact without lesions or rashes.  Neurologic: Alert and oriented x 3.  Psych: Normal affect. Extremities: No clubbing or cyanosis.  HEENT: Normal.    Recent Labs: No results found for requested labs within last 8760 hours.  Personally reviewed   Wt Readings from Last 3  Encounters:  04/20/19 82.6 kg (182 lb)  12/20/17 81.4 kg (179 lb 6.4 oz)  09/10/17 81.6 kg (179 lb 12.8 oz)      ASSESSMENT AND PLAN:  1. CAD: s/p CABG x 4 in 2017.  Had atypical symptoms but abnormal stress echo in 2/19.  This was followed by cath showing 2/4 grafts occluded (presumably SVGs).  He was told that the vessels supplied by the occluded grafts had collaterals.  Echo in 5/19 showed EF 55-60%.  He has no chest pain or exertional dyspnea.   - Continue ASA 81 - Continue atorvastatin, good lipids in 2/21.  - Continue Toprol XL.  - Increase lisinopril to 5 mg daily for secondary prevention.  Check BMET in 2 wks.   2. Hyperlipidemia: good lipids in 2/21.  3. Atrial fibrillation: As far as I can tell, only noted peri-op CABG.  If recurs, will need anticoagulation.  4. HTN: BP controlled. No OSA on 5/19 sleep study.   Followup in 1 year.    Signed, Loralie Champagne, MD  04/21/2019   [place] [address] [facility] and [place] [address] [phone] (office) [phone] (fax)

## 2019-05-18 ENCOUNTER — Encounter (HOSPITAL_COMMUNITY): Payer: Medicare Other | Admitting: Cardiology

## 2019-06-07 ENCOUNTER — Other Ambulatory Visit (HOSPITAL_COMMUNITY): Payer: Self-pay | Admitting: Cardiology

## 2019-12-25 ENCOUNTER — Other Ambulatory Visit (HOSPITAL_COMMUNITY): Payer: Self-pay | Admitting: Cardiology

## 2020-03-03 ENCOUNTER — Other Ambulatory Visit: Payer: Self-pay

## 2020-03-03 ENCOUNTER — Encounter: Payer: Self-pay | Admitting: Ophthalmology

## 2020-03-10 ENCOUNTER — Other Ambulatory Visit: Admission: RE | Admit: 2020-03-10 | Payer: Medicare Other | Source: Ambulatory Visit

## 2020-03-10 NOTE — Discharge Instructions (Signed)

## 2020-03-11 ENCOUNTER — Other Ambulatory Visit: Payer: Self-pay

## 2020-03-11 ENCOUNTER — Other Ambulatory Visit
Admission: RE | Admit: 2020-03-11 | Discharge: 2020-03-11 | Disposition: A | Discharge status: Home / Self Care | Payer: Medicare Other | Source: Ambulatory Visit | Attending: Ophthalmology | Admitting: Ophthalmology

## 2020-03-11 DIAGNOSIS — Z20822 Contact with and (suspected) exposure to covid-19: Secondary | ICD-10-CM | POA: Diagnosis not present

## 2020-03-11 DIAGNOSIS — Z01812 Encounter for preprocedural laboratory examination: Secondary | ICD-10-CM | POA: Diagnosis present

## 2020-03-11 LAB — SARS CORONAVIRUS 2 (TAT 6-24 HRS): SARS Coronavirus 2: NEGATIVE

## 2020-03-14 ENCOUNTER — Other Ambulatory Visit: Payer: Self-pay

## 2020-03-14 ENCOUNTER — Ambulatory Visit: Payer: Medicare Other | Admitting: Anesthesiology

## 2020-03-14 ENCOUNTER — Encounter
Admission: RE | Disposition: A | Discharge status: Home / Self Care | Payer: Self-pay | Source: Home / Self Care | Attending: Ophthalmology

## 2020-03-14 ENCOUNTER — Encounter: Payer: Self-pay | Admitting: Ophthalmology

## 2020-03-14 ENCOUNTER — Ambulatory Visit
Admission: RE | Admit: 2020-03-14 | Discharge: 2020-03-14 | Disposition: A | Discharge status: Home / Self Care | Payer: Medicare Other | Attending: Ophthalmology | Admitting: Ophthalmology

## 2020-03-14 DIAGNOSIS — H401121 Primary open-angle glaucoma, left eye, mild stage: Secondary | ICD-10-CM | POA: Insufficient documentation

## 2020-03-14 DIAGNOSIS — Z951 Presence of aortocoronary bypass graft: Secondary | ICD-10-CM | POA: Insufficient documentation

## 2020-03-14 DIAGNOSIS — Z7982 Long term (current) use of aspirin: Secondary | ICD-10-CM | POA: Insufficient documentation

## 2020-03-14 DIAGNOSIS — Z87891 Personal history of nicotine dependence: Secondary | ICD-10-CM | POA: Diagnosis not present

## 2020-03-14 DIAGNOSIS — Z955 Presence of coronary angioplasty implant and graft: Secondary | ICD-10-CM | POA: Insufficient documentation

## 2020-03-14 DIAGNOSIS — Z79899 Other long term (current) drug therapy: Secondary | ICD-10-CM | POA: Insufficient documentation

## 2020-03-14 DIAGNOSIS — H2512 Age-related nuclear cataract, left eye: Secondary | ICD-10-CM | POA: Diagnosis not present

## 2020-03-14 HISTORY — DX: Personal history of other specified conditions: Z87.898

## 2020-03-14 HISTORY — DX: Pure hypercholesterolemia, unspecified: E78.00

## 2020-03-14 HISTORY — DX: Presence of dental prosthetic device (complete) (partial): Z97.2

## 2020-03-14 HISTORY — DX: Unspecified osteoarthritis, unspecified site: M19.90

## 2020-03-14 HISTORY — PX: CATARACT EXTRACTION W/PHACO: SHX586

## 2020-03-14 SURGERY — PHACOEMULSIFICATION, CATARACT, WITH IOL INSERTION
Anesthesia: Monitor Anesthesia Care | Site: Eye | Laterality: Left

## 2020-03-14 MED ORDER — EPINEPHRINE PF 1 MG/ML IJ SOLN
INTRAOCULAR | Status: DC | PRN
  Administered 2020-03-14: 83 mL via OPHTHALMIC

## 2020-03-14 MED ORDER — ACETAMINOPHEN 325 MG PO TABS: 325.0000 mg | ORAL_TABLET | Freq: Once | ORAL | Status: DC

## 2020-03-14 MED ORDER — SODIUM HYALURONATE 23 MG/ML IO SOLN
INTRAOCULAR | Status: DC | PRN
  Administered 2020-03-14: 0.6 mL via INTRAOCULAR

## 2020-03-14 MED ORDER — LIDOCAINE HCL (PF) 2 % IJ SOLN
INTRAOCULAR | Status: DC | PRN
  Administered 2020-03-14: 4 mL via INTRAOCULAR

## 2020-03-14 MED ORDER — FENTANYL CITRATE (PF) 100 MCG/2ML IJ SOLN
INTRAMUSCULAR | Status: DC | PRN
  Administered 2020-03-14: 50 ug via INTRAVENOUS

## 2020-03-14 MED ORDER — TETRACAINE HCL 0.5 % OP SOLN
1.0000 [drp] | OPHTHALMIC | Status: DC | PRN
  Administered 2020-03-14 (×3): 1 [drp] via OPHTHALMIC

## 2020-03-14 MED ORDER — MOXIFLOXACIN HCL 0.5 % OP SOLN
OPHTHALMIC | Status: DC | PRN
  Administered 2020-03-14: 0.2 mL via OPHTHALMIC

## 2020-03-14 MED ORDER — MIDAZOLAM HCL 2 MG/2ML IJ SOLN
INTRAMUSCULAR | Status: DC | PRN
  Administered 2020-03-14: 2 mg via INTRAVENOUS

## 2020-03-14 MED ORDER — SODIUM HYALURONATE 10 MG/ML IO SOLN
INTRAOCULAR | Status: DC | PRN
  Administered 2020-03-14: 0.55 mL via INTRAOCULAR

## 2020-03-14 MED ORDER — ACETAMINOPHEN 160 MG/5ML PO SOLN: 325.0000 mg | Freq: Once | ORAL | Status: DC

## 2020-03-14 MED ORDER — LACTATED RINGERS IV SOLN: INTRAVENOUS | Status: DC

## 2020-03-14 MED ORDER — ARMC OPHTHALMIC DILATING DROPS
1.0000 "application " | OPHTHALMIC | Status: DC | PRN
  Administered 2020-03-14 (×3): 1 via OPHTHALMIC

## 2020-03-14 SURGICAL SUPPLY — 21 items
CANNULA ANT/CHMB 27GA (MISCELLANEOUS) ×4 IMPLANT
DEVICE INJECT ISTENT W (Stent) ×1 IMPLANT
DISSECTOR HYDRO NUCLEUS 50X22 (MISCELLANEOUS) ×2 IMPLANT
GLOVE SURG LX 7.5 STRW (GLOVE) ×1
GLOVE SURG LX STRL 7.5 STRW (GLOVE) ×1 IMPLANT
GLOVE SURG SYN 8.5  E (GLOVE) ×2
GLOVE SURG SYN 8.5 E (GLOVE) ×1 IMPLANT
GLOVE SURG SYN 8.5 PF PI (GLOVE) ×1 IMPLANT
GOWN STRL REUS W/ TWL LRG LVL3 (GOWN DISPOSABLE) ×2 IMPLANT
GOWN STRL REUS W/TWL LRG LVL3 (GOWN DISPOSABLE) ×4
ICLIP (OPHTHALMIC RELATED) ×1 IMPLANT
INJECT ISTENT W (Stent) ×2 IMPLANT
LENS IOL TECNIS EYHANCE 21.5 (Intraocular Lens) ×2 IMPLANT
MARKER SKIN DUAL TIP RULER LAB (MISCELLANEOUS) ×2 IMPLANT
PACK DR. KING ARMS (PACKS) ×2 IMPLANT
PACK EYE AFTER SURG (MISCELLANEOUS) ×2 IMPLANT
PACK OPTHALMIC (MISCELLANEOUS) ×2 IMPLANT
SYR 3ML LL SCALE MARK (SYRINGE) ×2 IMPLANT
SYR TB 1ML LUER SLIP (SYRINGE) ×2 IMPLANT
WATER STERILE IRR 250ML POUR (IV SOLUTION) ×2 IMPLANT
WIPE NON LINTING 3.25X3.25 (MISCELLANEOUS) ×2 IMPLANT

## 2020-03-14 NOTE — Op Note (Signed)
OPERATIVE NOTE  TEION BALLIN [id number] 03/14/2020  PREOPERATIVE DIAGNOSIS:   1.  Mild  PRIMARY open angle glaucoma, left eye. U23.5361 2.  Nuclear sclerotic cataract left eye.  H25.12   POSTOPERATIVE DIAGNOSIS:    same.   PROCEDURE:   1.  Placement of trabecular bypass stent (istent). And phacoemusification with posterior chamber intraocular lens placement of the left eye  CPT [id number]   LENS: Implant Name Type Inv. Item Serial No. Manufacturer Lot No. LRB No. Used Action  LENS IOL TECNIS EYHANCE 21.5 - [id number] Intraocular Lens LENS IOL TECNIS EYHANCE 21.5 [id number] [facility]   Left 1 Implanted  Marko Stai - [id number] Stent Marko Stai [id number] [employer] [id number] Left 1 Implanted      Procedure(s): CATARACT EXTRACTION PHACO AND INTRAOCULAR LENS PLACEMENT (IOC) LEFT ISTENT INJ 5.63 00:37.5 (Left)  DIB00 +21.5  ULTRASOUND TIME: 0 minutes 37 seconds.  CDE 5.63   SURGEON:  Benay Pillow, MD, MPH  ANESTHESIOLOGIST: Anesthesiologist: Ronelle Nigh, MD CRNA: Silvana Newness, CRNA; Cameron Ali, CRNA   ANESTHESIA:  MAC and 4% preservative free lidocaine with a small amount of vitrase  and intracameral preservative-free intracameral lidocaine 4%.  ESTIMATED BLOOD LOSS: less than 1 mL.   COMPLICATIONS:  None.   DESCRIPTION OF PROCEDURE:  The patient was identified in the holding room and transported to the operating room.  The patient was placed in the supine position under the operating microscope.  The left eye was prepped and draped in the usual sterile ophthalmic fashion.   A 1.0 millimeter clear-corneal paracentesis was made at the 4:30 position. 0.5 ml of preservative-free 1% lidocaine with epinephrine was injected into the anterior chamber.  The anterior chamber was filled with Healon 5 viscoelastic.  A 2.4 millimeter keratome was used to make a near-clear corneal incision at the 2:00 position.   Attention was turned to the istent.  The patients  head was turned to the left and the microscope was tilted to 035 degrees.  Ocular instruments/Glaukos OAL/H2 gonioprism was used with IPC05 (iclip) coupled with Healon 5 on the cornea was used to visualize the trabecular meshwork. The istent was opened and introduced into the eye.  The meshwork was engaged with the tip of the iStent injector and the stent was deployed into Schlemm's canal at 10:30.  The second stent was deployed at 8:00.  The stents were well seated and in good position.  Next, attention was turned to the phacoemulsification A curvilinear capsulorrhexis was made with a cystotome and capsulorrhexis forceps.  Balanced salt solution was used to hydrodissect and hydrodelineate the nucleus.   Phacoemulsification was then used in stop and chop fashion to remove the lens nucleus and epinucleus.  The remaining cortex was then removed using the irrigation and aspiration handpiece. Healon was then placed into the capsular bag to distend it for lens placement.  A lens was then injected into the capsular bag.  The remaining viscoelastic was aspirated.   Wounds were hydrated with balanced salt solution.  The anterior chamber was inflated to a physiologic pressure with balanced salt solution.   Intracameral vigamox 0.1 mL undiluted was injected into the eye and a drop placed onto the ocular surface.  No wound leaks were noted.  Protective glasses were placed on the patient.  The patient was taken to the recovery room in stable condition without complications of anesthesia or surgery   Benay Pillow 03/14/2020, 1:31 PM

## 2020-03-14 NOTE — Transfer of Care (Signed)
Immediate Anesthesia Transfer of Care Note  Patient: Peter Baxter  Procedure(s) Performed: CATARACT EXTRACTION PHACO AND INTRAOCULAR LENS PLACEMENT (IOC) LEFT ISTENT INJ 5.63 00:37.5 (Left Eye)  Patient Location: PACU  Anesthesia Type: MAC  Level of Consciousness: awake, alert  and patient cooperative  Airway and Oxygen Therapy: Patient Spontanous Breathing and Patient connected to supplemental oxygen  Post-op Assessment: Post-op Vital signs reviewed, Patient's Cardiovascular Status Stable, Respiratory Function Stable, Patent Airway and No signs of Nausea or vomiting  Post-op Vital Signs: Reviewed and stable  Complications: No complications documented.

## 2020-03-14 NOTE — Anesthesia Procedure Notes (Signed)
Procedure Name: MAC Date/Time: 03/14/2020 1:10 PM Performed by: Silvana Newness, CRNA Pre-anesthesia Checklist: Patient identified, Emergency Drugs available, Suction available, Patient being monitored and Timeout performed Patient Re-evaluated:Patient Re-evaluated prior to induction Oxygen Delivery Method: Nasal cannula Placement Confirmation: positive ETCO2

## 2020-03-14 NOTE — Anesthesia Preprocedure Evaluation (Signed)
Anesthesia Evaluation  Patient identified by MRN, date of birth, ID band Patient awake    Reviewed: Allergy & Precautions, H&P , NPO status , Patient's Chart, lab work & pertinent test results  Airway Mallampati: II  TM Distance: >3 FB Neck ROM: full    Dental no notable dental hx.    Pulmonary former smoker,    Pulmonary exam normal breath sounds clear to auscultation       Cardiovascular hypertension, + CAD and + CABG  + dysrhythmias Atrial Fibrillation  Rhythm:regular Rate:Normal     Neuro/Psych    GI/Hepatic GERD  ,  Endo/Other    Renal/GU      Musculoskeletal   Abdominal   Peds  Hematology   Anesthesia Other Findings   Reproductive/Obstetrics                             Anesthesia Physical Anesthesia Plan  ASA: III  Anesthesia Plan: MAC   Post-op Pain Management:    Induction:   PONV Risk Score and Plan: 1 and Treatment may vary due to age or medical condition, Midazolam and TIVA  Airway Management Planned:   Additional Equipment:   Intra-op Plan:   Post-operative Plan:   Informed Consent: I have reviewed the patients History and Physical, chart, labs and discussed the procedure including the risks, benefits and alternatives for the proposed anesthesia with the patient or authorized representative who has indicated his/her understanding and acceptance.     Dental Advisory Given  Plan Discussed with: CRNA  Anesthesia Plan Comments:         Anesthesia Quick Evaluation

## 2020-03-14 NOTE — Anesthesia Postprocedure Evaluation (Signed)
Anesthesia Post Note  Patient: Peter Baxter  Procedure(s) Performed: CATARACT EXTRACTION PHACO AND INTRAOCULAR LENS PLACEMENT (IOC) LEFT ISTENT INJ 5.63 00:37.5 (Left Eye)     Patient location during evaluation: PACU Anesthesia Type: MAC Level of consciousness: awake and alert and oriented Pain management: satisfactory to patient Vital Signs Assessment: post-procedure vital signs reviewed and stable Respiratory status: spontaneous breathing, nonlabored ventilation and respiratory function stable Cardiovascular status: blood pressure returned to baseline and stable Postop Assessment: Adequate PO intake and No signs of nausea or vomiting Anesthetic complications: no   No complications documented.  Raliegh Ip

## 2020-03-14 NOTE — H&P (Signed)
Peter [facility], [facility] Ophthalmologist: Dr. Benay Pillow  Pre-Procedure History & Physical: HPI:  DACOTAH CABELLO is a 70 y.o. male here for cataract surgery.   Past Medical History:  Diagnosis Date  . AF (atrial fibrillation) ([place])    after shoulder surgery in 2017 none since  . Anemia    pt denies  . Arthritis    hands  . CAD (coronary artery disease)   . CAD (coronary artery disease)   . Carotid artery stenosis   . Complication of anesthesia    atrial fib after shoulder surgery 2017  after got to room none since  . Dental bridge present    doesnt wear  . Duodenal bulb ulcer   . Gastropathy   . GERD (gastroesophageal reflux disease)   . Glaucoma    both  eyes  . Heart disease   . Hepatitis B   . History of neck problems   . Hypercholesterolemia   . Hypertension   . Inguinal pain   . Pre-diabetes   . Rotator cuff tear    left shoulder    Past Surgical History:  Procedure Laterality Date  . angioplasty Peter stent    . cad    . CARDIAC CATHETERIZATION  2009   3 heart stents 97 Peter 99  . cc/stents    . COLONOSCOPY WITH PROPOFOL N/A 11/07/2016   Procedure: COLONOSCOPY WITH PROPOFOL;  Surgeon: Manya Silvas, MD;  Location: [facility] ENDOSCOPY;  Service: Endoscopy;  Laterality: N/A;  . COLONOSCOPY, ESOPHAGOGASTRODUODENOSCOPY (EGD) Peter ESOPHAGEAL DILATION    . CORONARY ANGIOPLASTY  03/2017  . CORONARY ARTERY BYPASS GRAFT  08/2015  . ESOPHAGOGASTRODUODENOSCOPY (EGD) WITH PROPOFOL N/A 11/07/2016   Procedure: ESOPHAGOGASTRODUODENOSCOPY (EGD) WITH PROPOFOL;  Surgeon: Manya Silvas, MD;  [facility] ENDOSCOPY;  Service: Endoscopy;  Laterality: N/A;  . HYDROCELE EXCISION Bilateral 12/20/2017   Procedure: HYDROCELECTOMY ADULT;  Surgeon: Kathie Rhodes, MD;  Location[facility];  Service: Urology;  Laterality: Bilateral;  . stents N/A 2005   x 4   . TONSILLECTOMY    . VASCULAR SURGERY      Prior to  Admission medications   Medication Sig Start Date End Date Taking? Authorizing Provider  aspirin EC 81 MG tablet Take by mouth.   Yes [provider]  latanoprost (XALATAN) 0.005 % ophthalmic solution Place 1 drop into both eyes at bedtime.  06/05/14  Yes [provider]  lisinopril (ZESTRIL) 5 MG tablet Take 1 tablet (5 mg total) by mouth at bedtime. 04/20/19  Yes Larey Dresser, MD  metoprolol succinate (TOPROL-XL) 25 MG 24 hr tablet TAKE 1 TABLET BY MOUTH EVERYDAY AT BEDTIME 12/25/19  Yes Larey Dresser, MD  atorvastatin (LIPITOR) 10 MG tablet TAKE 1 TABLET (10 MG TOTAL) BY MOUTH DAILY AT 6 PM. 06/08/19   Larey Dresser, MD  pantoprazole (PROTONIX) 40 MG tablet Take 1 tablet (40 mg total) by mouth daily. TAKE 1 TABLET BY MOUTH ONCE DAILY Patient taking differently: Take 40 mg by mouth at bedtime. TAKE 1 TABLET BY MOUTH ONCE DAILY 06/11/14   Rubbie Battiest, RN    Allergies as of 01/05/2020  . (No Known Allergies)    Family History  Problem Relation Age of Onset  . Heart disease Mother   . Heart disease Father   . Heart disease Brother     Social History   Socioeconomic History  . Marital status: Married  Spouse name: Not on file  . Number of children: Not on file  . Years of education: Not on file  . Highest education level: Not on file  Occupational History  . Not on file  Tobacco Use  . Smoking status: Former Smoker    Packs/day: 1.00    Years: 20.00    Pack years: 20.00    Types: Cigarettes  . Smokeless tobacco: Former Systems developer    Quit date: 06/12/1993  . Tobacco comment: quit 1995  Vaping Use  . Vaping Use: Never used  Substance Peter Sexual Activity  . Alcohol use: No    Alcohol/week: 0.0 standard drinks  . Drug use: No  . Sexual activity: Yes    Partners: Female    Comment: 1 partner   Other Topics Concern  . Not on file  Social History Narrative   Works at [employer] in [employer] with wife    Children: 2    Pets: None   Left handed     Caffeine- 2 coffee, 1 soda a day    Enjoys building, working on his [employer]    Social Determinants of Radio broadcast assistant Strain: Not on file  Food Insecurity: Not on file  Transportation Needs: Not on file  Physical Activity: Not on file  Stress: Not on file  Social Connections: Not on file  Intimate Partner Violence: Not on file    Review of Systems: See HPI, otherwise negative ROS  Physical Exam: BP 114/64   Pulse (!) 54   Temp 97.6 F (36.4 C) (Temporal)   Resp 16   Ht 5\' 8"  (1.727 m)   Wt 80.3 kg   SpO2 98%   BMI 26.91 kg/m  General:   Alert,  pleasant Peter cooperative in NAD Head:  Normocephalic Peter atraumatic. Respiratory:  Normal work of breathing.  Impression/Plan: [name] is here for cataract surgery.  Baxter, Peter Baxter, Peter Baxter, Peter Baxter answered.  All parties agreeable.   Benay Pillow, MD  03/14/2020, 12:59 PM

## 2020-03-17 ENCOUNTER — Encounter: Payer: Self-pay | Admitting: Ophthalmology

## 2020-03-17 ENCOUNTER — Other Ambulatory Visit: Payer: Self-pay

## 2020-03-24 ENCOUNTER — Other Ambulatory Visit: Admission: RE | Admit: 2020-03-24 | Payer: Medicare Other | Source: Ambulatory Visit

## 2020-03-24 ENCOUNTER — Other Ambulatory Visit (HOSPITAL_COMMUNITY): Payer: Self-pay | Admitting: Cardiology

## 2020-03-25 ENCOUNTER — Other Ambulatory Visit
Admission: RE | Admit: 2020-03-25 | Discharge: 2020-03-25 | Disposition: A | Discharge status: Home / Self Care | Payer: Medicare Other | Source: Ambulatory Visit | Attending: Ophthalmology | Admitting: Ophthalmology

## 2020-03-25 ENCOUNTER — Other Ambulatory Visit: Payer: Self-pay

## 2020-03-25 DIAGNOSIS — Z20822 Contact with and (suspected) exposure to covid-19: Secondary | ICD-10-CM | POA: Diagnosis not present

## 2020-03-25 DIAGNOSIS — Z01812 Encounter for preprocedural laboratory examination: Secondary | ICD-10-CM | POA: Diagnosis present

## 2020-03-25 LAB — SARS CORONAVIRUS 2 (TAT 6-24 HRS): SARS Coronavirus 2: NEGATIVE

## 2020-03-28 ENCOUNTER — Ambulatory Visit
Admission: RE | Admit: 2020-03-28 | Discharge: 2020-03-28 | Disposition: A | Discharge status: Home / Self Care | Payer: Medicare Other | Attending: Ophthalmology | Admitting: Ophthalmology

## 2020-03-28 ENCOUNTER — Encounter: Payer: Self-pay | Admitting: Ophthalmology

## 2020-03-28 ENCOUNTER — Ambulatory Visit: Payer: Medicare Other | Admitting: Anesthesiology

## 2020-03-28 ENCOUNTER — Other Ambulatory Visit: Payer: Self-pay

## 2020-03-28 ENCOUNTER — Encounter
Admission: RE | Disposition: A | Discharge status: Home / Self Care | Payer: Self-pay | Source: Home / Self Care | Attending: Ophthalmology

## 2020-03-28 DIAGNOSIS — Z7982 Long term (current) use of aspirin: Secondary | ICD-10-CM | POA: Insufficient documentation

## 2020-03-28 DIAGNOSIS — Z79899 Other long term (current) drug therapy: Secondary | ICD-10-CM | POA: Diagnosis not present

## 2020-03-28 DIAGNOSIS — I6529 Occlusion and stenosis of unspecified carotid artery: Secondary | ICD-10-CM | POA: Diagnosis not present

## 2020-03-28 DIAGNOSIS — H2511 Age-related nuclear cataract, right eye: Secondary | ICD-10-CM | POA: Insufficient documentation

## 2020-03-28 DIAGNOSIS — Z8249 Family history of ischemic heart disease and other diseases of the circulatory system: Secondary | ICD-10-CM | POA: Diagnosis not present

## 2020-03-28 DIAGNOSIS — Z951 Presence of aortocoronary bypass graft: Secondary | ICD-10-CM | POA: Diagnosis not present

## 2020-03-28 DIAGNOSIS — I4891 Unspecified atrial fibrillation: Secondary | ICD-10-CM | POA: Insufficient documentation

## 2020-03-28 DIAGNOSIS — I2510 Atherosclerotic heart disease of native coronary artery without angina pectoris: Secondary | ICD-10-CM | POA: Insufficient documentation

## 2020-03-28 DIAGNOSIS — Z87891 Personal history of nicotine dependence: Secondary | ICD-10-CM | POA: Insufficient documentation

## 2020-03-28 DIAGNOSIS — B1910 Unspecified viral hepatitis B without hepatic coma: Secondary | ICD-10-CM | POA: Insufficient documentation

## 2020-03-28 DIAGNOSIS — Z955 Presence of coronary angioplasty implant and graft: Secondary | ICD-10-CM | POA: Insufficient documentation

## 2020-03-28 DIAGNOSIS — I10 Essential (primary) hypertension: Secondary | ICD-10-CM | POA: Insufficient documentation

## 2020-03-28 DIAGNOSIS — H401111 Primary open-angle glaucoma, right eye, mild stage: Secondary | ICD-10-CM | POA: Insufficient documentation

## 2020-03-28 DIAGNOSIS — E7800 Pure hypercholesterolemia, unspecified: Secondary | ICD-10-CM | POA: Diagnosis not present

## 2020-03-28 HISTORY — PX: CATARACT EXTRACTION W/PHACO: SHX586

## 2020-03-28 SURGERY — PHACOEMULSIFICATION, CATARACT, WITH IOL INSERTION
Anesthesia: Monitor Anesthesia Care | Site: Eye | Laterality: Right

## 2020-03-28 MED ORDER — SODIUM HYALURONATE 10 MG/ML IO SOLN
INTRAOCULAR | Status: DC | PRN
  Administered 2020-03-28: 0.55 mL via INTRAOCULAR

## 2020-03-28 MED ORDER — FENTANYL CITRATE (PF) 100 MCG/2ML IJ SOLN
INTRAMUSCULAR | Status: DC | PRN
  Administered 2020-03-28 (×2): 50 ug via INTRAVENOUS

## 2020-03-28 MED ORDER — MOXIFLOXACIN HCL 0.5 % OP SOLN
OPHTHALMIC | Status: DC | PRN
  Administered 2020-03-28: 0.2 mL via OPHTHALMIC

## 2020-03-28 MED ORDER — MIDAZOLAM HCL 2 MG/2ML IJ SOLN
INTRAMUSCULAR | Status: DC | PRN
  Administered 2020-03-28 (×2): 1 mg via INTRAVENOUS

## 2020-03-28 MED ORDER — LACTATED RINGERS IV SOLN: INTRAVENOUS | Status: DC

## 2020-03-28 MED ORDER — TETRACAINE HCL 0.5 % OP SOLN
1.0000 [drp] | OPHTHALMIC | Status: DC | PRN
  Administered 2020-03-28 (×3): 1 [drp] via OPHTHALMIC

## 2020-03-28 MED ORDER — SODIUM HYALURONATE 23 MG/ML IO SOLN
INTRAOCULAR | Status: DC | PRN
  Administered 2020-03-28: 0.6 mL via INTRAOCULAR

## 2020-03-28 MED ORDER — ARMC OPHTHALMIC DILATING DROPS
1.0000 "application " | OPHTHALMIC | Status: DC | PRN
  Administered 2020-03-28 (×3): 1 via OPHTHALMIC

## 2020-03-28 MED ORDER — EPINEPHRINE PF 1 MG/ML IJ SOLN
INTRAOCULAR | Status: DC | PRN
  Administered 2020-03-28: 75 mL via OPHTHALMIC

## 2020-03-28 MED ORDER — LIDOCAINE HCL (PF) 2 % IJ SOLN
INTRAOCULAR | Status: DC | PRN
  Administered 2020-03-28: 1 mL via INTRAOCULAR

## 2020-03-28 SURGICAL SUPPLY — 22 items
CANNULA ANT/CHMB 27G (MISCELLANEOUS) ×2 IMPLANT
CANNULA ANT/CHMB 27GA (MISCELLANEOUS) ×4 IMPLANT
DEVICE INJECT ISTENT W (Stent) IMPLANT
DISSECTOR HYDRO NUCLEUS 50X22 (MISCELLANEOUS) ×2 IMPLANT
GLOVE SURG LX 7.5 STRW (GLOVE) ×2
GLOVE SURG LX STRL 7.5 STRW (GLOVE) ×1 IMPLANT
GLOVE SURG SYN 8.5  E (GLOVE) ×2
GLOVE SURG SYN 8.5 E (GLOVE) ×1 IMPLANT
GLOVE SURG SYN 8.5 PF PI (GLOVE) ×1 IMPLANT
GOWN STRL REUS W/ TWL LRG LVL3 (GOWN DISPOSABLE) ×2 IMPLANT
GOWN STRL REUS W/TWL LRG LVL3 (GOWN DISPOSABLE) ×4
ICLIP (OPHTHALMIC RELATED) ×1 IMPLANT
INJECT ISTENT W (Stent) ×2 IMPLANT
LENS IOL TECNIS EYHANCE 21.5 (Intraocular Lens) ×1 IMPLANT
MARKER SKIN DUAL TIP RULER LAB (MISCELLANEOUS) ×2 IMPLANT
PACK DR. KING ARMS (PACKS) ×2 IMPLANT
PACK EYE AFTER SURG (MISCELLANEOUS) ×2 IMPLANT
PACK OPTHALMIC (MISCELLANEOUS) ×2 IMPLANT
SYR 3ML LL SCALE MARK (SYRINGE) ×2 IMPLANT
SYR TB 1ML LUER SLIP (SYRINGE) ×2 IMPLANT
WATER STERILE IRR 250ML POUR (IV SOLUTION) ×2 IMPLANT
WIPE NON LINTING 3.25X3.25 (MISCELLANEOUS) ×2 IMPLANT

## 2020-03-28 NOTE — Anesthesia Preprocedure Evaluation (Signed)
Anesthesia Evaluation  Patient identified by MRN, date of birth, ID band Patient awake    Reviewed: Allergy & Precautions, H&P , NPO status , Patient's Chart, lab work & pertinent test results  Airway Mallampati: II  TM Distance: >3 FB Neck ROM: full    Dental no notable dental hx.    Pulmonary former smoker,    Pulmonary exam normal        Cardiovascular hypertension, + CAD and + CABG (CABG x4 vessel)  Normal cardiovascular exam+ dysrhythmias Atrial Fibrillation      Neuro/Psych negative psych ROS   GI/Hepatic Neg liver ROS, GERD  ,  Endo/Other  negative endocrine ROS  Renal/GU negative Renal ROS     Musculoskeletal  (+) Arthritis ,   Abdominal Normal abdominal exam  (+)   Peds  Hematology negative hematology ROS (+)   Anesthesia Other Findings   Reproductive/Obstetrics                             Anesthesia Physical  Anesthesia Plan  ASA: III  Anesthesia Plan: MAC   Post-op Pain Management:    Induction:   PONV Risk Score and Plan: 1 and Treatment may vary due to age or medical condition, Midazolam and TIVA  Airway Management Planned: Natural Airway and Nasal Cannula  Additional Equipment:   Intra-op Plan:   Post-operative Plan:   Informed Consent: I have reviewed the patients History and Physical, chart, labs and discussed the procedure including the risks, benefits and alternatives for the proposed anesthesia with the patient or authorized representative who has indicated his/her understanding and acceptance.     Dental Advisory Given  Plan Discussed with: CRNA  Anesthesia Plan Comments:         Anesthesia Quick Evaluation

## 2020-03-28 NOTE — H&P (Signed)
[facility]   Primary Care Physician:  Medicine, [facility] Ophthalmologist: Dr. Benay Pillow  Pre-Procedure History & Physical: HPI:  Peter Baxter is a 70 y.o. male here for cataract surgery + istent.   Past Medical History:  Diagnosis Date  . AF (atrial fibrillation) ([place])    after shoulder surgery in 2017 none since  . Anemia    pt denies  . Arthritis    hands  . CAD (coronary artery disease)   . CAD (coronary artery disease)   . Carotid artery stenosis   . Complication of anesthesia    atrial fib after shoulder surgery 2017  after got to room none since  . Dental bridge present    doesnt wear  . Duodenal bulb ulcer   . Gastropathy   . GERD (gastroesophageal reflux disease)   . Glaucoma    both  eyes  . Heart disease   . Hepatitis B   . History of neck problems   . Hypercholesterolemia   . Hypertension   . Inguinal pain   . Pre-diabetes   . Rotator cuff tear    left shoulder    Past Surgical History:  Procedure Laterality Date  . angioplasty and stent    . cad    . CARDIAC CATHETERIZATION  2009   3 heart stents 97 and 99  . CATARACT EXTRACTION W/PHACO Left 03/14/2020   Procedure: CATARACT EXTRACTION PHACO AND INTRAOCULAR LENS PLACEMENT (IOC) LEFT ISTENT INJ 5.63 00:37.5;  Surgeon: Eulogio Bear, MD;  Location: [place];  Service: Ophthalmology;  Laterality: Left;  . cc/stents    . COLONOSCOPY WITH PROPOFOL N/A 11/07/2016   Procedure: COLONOSCOPY WITH PROPOFOL;  Surgeon: Manya Silvas, MD;  Location: [facility] ENDOSCOPY;  Service: Endoscopy;  Laterality: N/A;  . COLONOSCOPY, ESOPHAGOGASTRODUODENOSCOPY (EGD) AND ESOPHAGEAL DILATION    . CORONARY ANGIOPLASTY  03/2017  . CORONARY ARTERY BYPASS GRAFT  08/2015  . ESOPHAGOGASTRODUODENOSCOPY (EGD) WITH PROPOFOL N/A 11/07/2016   Procedure: ESOPHAGOGASTRODUODENOSCOPY (EGD) WITH PROPOFOL;  Surgeon: Manya Silvas, MD;  Location: [facility] ENDOSCOPY;  Service: Endoscopy;  Laterality: N/A;  .  HYDROCELE EXCISION Bilateral 12/20/2017   Procedure: HYDROCELECTOMY ADULT;  Surgeon: Kathie Rhodes, MD;  Location: [facility];  Service: Urology;  Laterality: Bilateral;  . stents N/A 2005   x 4   . TONSILLECTOMY    . VASCULAR SURGERY      Prior to Admission medications   Medication Sig Start Date End Date Taking? Authorizing Provider  aspirin EC 81 MG tablet Take by mouth.   Yes [provider]  atorvastatin (LIPITOR) 10 MG tablet TAKE 1 TABLET (10 MG TOTAL) BY MOUTH DAILY AT 6 PM. 06/08/19  Yes Larey Dresser, MD  latanoprost (XALATAN) 0.005 % ophthalmic solution Place 1 drop into both eyes at bedtime.  06/05/14  Yes [provider]  lisinopril (ZESTRIL) 5 MG tablet Take 1 tablet (5 mg total) by mouth at bedtime. 03/25/20  Yes Larey Dresser, MD  metoprolol succinate (TOPROL-XL) 25 MG 24 hr tablet TAKE 1 TABLET BY MOUTH EVERYDAY AT BEDTIME 12/25/19  Yes Larey Dresser, MD  pantoprazole (PROTONIX) 40 MG tablet Take 1 tablet (40 mg total) by mouth daily. TAKE 1 TABLET BY MOUTH ONCE DAILY Patient taking differently: Take 40 mg by mouth at bedtime. TAKE 1 TABLET BY MOUTH ONCE DAILY 06/11/14  Yes Rubbie Battiest, RN    Allergies as of 01/05/2020  . (No Known Allergies)  Family History  Problem Relation Age of Onset  . Heart disease Mother   . Heart disease Father   . Heart disease Brother     Social History   Socioeconomic History  . Marital status: Married    Spouse name: Not on file  . Number of children: Not on file  . Years of education: Not on file  . Highest education level: Not on file  Occupational History  . Not on file  Tobacco Use  . Smoking status: Former Smoker    Packs/day: 1.00    Years: 20.00    Pack years: 20.00    Types: Cigarettes  . Smokeless tobacco: Former Systems developer    Quit date: 06/12/1993  . Tobacco comment: quit 1995  Vaping Use  . Vaping Use: Never used  Substance and Sexual Activity  . Alcohol use: No     Alcohol/week: 0.0 standard drinks  . Drug use: No  . Sexual activity: Yes    Partners: Female    Comment: 1 partner   Other Topics Concern  . Not on file  Social History Narrative   Works at [employer] in [employer] with wife    Children: 2    Pets: None   Left handed    Caffeine- 2 coffee, 1 soda a day    Enjoys building, working on his [employer]    Social Determinants of Radio broadcast assistant Strain: Not on file  Food Insecurity: Not on file  Transportation Needs: Not on file  Physical Activity: Not on file  Stress: Not on file  Social Connections: Not on file  Intimate Partner Violence: Not on file    Review of Systems: See HPI, otherwise negative ROS  Physical Exam: BP 128/73   Pulse (!) 56   Temp 97.8 F (36.6 C) (Temporal)   Resp 16   Ht 5\' 8"  (1.727 m)   Wt 78.9 kg   SpO2 98%   BMI 26.46 kg/m  General:   Alert,  pleasant and cooperative in NAD Head:  Normocephalic and atraumatic. Respiratory:  Normal work of breathing.  Impression/Plan: Peter Baxter is here for cataract surgery and istent.  Risks, benefits, limitations, and alternatives regarding cataract surgery have been reviewed with the patient.  Questions have been answered.  All parties agreeable.   Benay Pillow, MD  03/28/2020, 12:39 PM

## 2020-03-28 NOTE — Transfer of Care (Signed)
Immediate Anesthesia Transfer of Care Note  Patient: Peter Baxter  Procedure(s) Performed: CATARACT EXTRACTION PHACO AND INTRAOCULAR LENS PLACEMENT (IOC) RIGHT ISTENT INJ (Right Eye)  Patient Location: PACU  Anesthesia Type: MAC  Level of Consciousness: awake, alert  and patient cooperative  Airway and Oxygen Therapy: Patient Spontanous Breathing and Patient connected to supplemental oxygen  Post-op Assessment: Post-op Vital signs reviewed, Patient's Cardiovascular Status Stable, Respiratory Function Stable, Patent Airway and No signs of Nausea or vomiting  Post-op Vital Signs: Reviewed and stable  Complications: No complications documented.

## 2020-03-28 NOTE — Anesthesia Procedure Notes (Signed)
Procedure Name: MAC Performed by: Duong Haydel, CRNA Pre-anesthesia Checklist: Patient identified, Emergency Drugs available, Suction available, Timeout performed and Patient being monitored Patient Re-evaluated:Patient Re-evaluated prior to induction Oxygen Delivery Method: Nasal cannula Placement Confirmation: positive ETCO2       

## 2020-03-28 NOTE — Op Note (Signed)
OPERATIVE NOTE  Peter Baxter [id number] 03/28/2020  PREOPERATIVE DIAGNOSIS:   1.  Mild PRIMARY open angle glaucoma, right eye. H40.1111 2.  Nuclear sclerotic cataract right eye.  H25.11   POSTOPERATIVE DIAGNOSIS:    same.   PROCEDURE:   1.  Placement of trabecular bypass stent (istent) and phacoemusification with posterior chamber intraocular lens placement of the right eye  CPT [id number]   LENS: Implant Name Type Inv. Item Serial No. Manufacturer Lot No. LRB No. Used Action  LENS IOL TECNIS EYHANCE 21.5 - [id number] Intraocular Lens LENS IOL TECNIS EYHANCE 21.5 [id number] [employer]   Right 1 Implanted  Marko Stai - [id number] Stent INJECT [employer] [id number] Right 1 Implanted      Procedure(s) with comments: CATARACT EXTRACTION PHACO AND INTRAOCULAR LENS PLACEMENT (IOC) RIGHT ISTENT INJ (Right) - 3.68 0:34.4  DIB00 +21.5   ULTRASOUND TIME: 0 minutes 34 seconds.  CDE 3.68   SURGEON:  Benay Pillow, MD, MPH  ANESTHESIOLOGIST: Anesthesiologist: Ardeth Sportsman, MD CRNA: Cameron Ali, CRNA   ANESTHESIA:  MAC and intracameral preservative-free lidocaine 4%.  ESTIMATED BLOOD LOSS: less than 1 mL.   COMPLICATIONS:  None.   DESCRIPTION OF PROCEDURE:  The patient was identified in the holding room and transported to the operating room.   The patient was placed in the supine position under the operating microscope.  The right eye was prepped and draped in the usual sterile ophthalmic fashion.   A 1.0 millimeter clear-corneal paracentesis was made at the 10:30 position. 0.5 ml of preservative-free 1% lidocaine with epinephrine was injected into the anterior chamber.  The anterior chamber was filled with Healon 5 viscoelastic.  A 2.4 millimeter keratome was used to make a near-clear corneal incision at the 8:00 position.   Attention was turned to the istent.  The patients head was turned to the left and the microscope was tilted to 035 degrees.  Ocular  instruments/Glaukos OAL/H2 gonioprism was used with IPC05 (iclip) coupled with Healon 5 on the cornea was used to visualize the trabecular meshwork. The istent was opened and introduced into the eye.  The meshwork was engaged with the tip of the iStent injector and the stent was deployed into Schlemm's canal at 2:00.  The second stent was deployed at 4:00.  The stents were well seated and in good position.  Next, attention was turned to the phacoemulsification A curvilinear capsulorrhexis was made with a cystotome and capsulorrhexis forceps.  Balanced salt solution was used to hydrodissect and hydrodelineate the nucleus.   Phacoemulsification was then used in stop and chop fashion to remove the lens nucleus and epinucleus.  The remaining cortex was then removed using the irrigation and aspiration handpiece. Healon was then placed into the capsular bag to distend it for lens placement.  A lens was then injected into the capsular bag.  The remaining viscoelastic was aspirated.   Wounds were hydrated with balanced salt solution.  The anterior chamber was inflated to a physiologic pressure with balanced salt solution.   Intracameral vigamox 0.1 mL undiluted was injected into the eye and a drop placed onto the ocular surface.  No wound leaks were noted. The patient was taken to the recovery room in stable condition without complications of anesthesia or surgery   Benay Pillow 03/28/2020, 1:13 PM

## 2020-03-28 NOTE — Anesthesia Postprocedure Evaluation (Signed)
Anesthesia Post Note  Patient: Peter Baxter  Procedure(s) Performed: CATARACT EXTRACTION PHACO AND INTRAOCULAR LENS PLACEMENT (IOC) RIGHT ISTENT INJ (Right Eye)     Patient location during evaluation: PACU Anesthesia Type: MAC Level of consciousness: awake and alert Pain management: pain level controlled Vital Signs Assessment: post-procedure vital signs reviewed and stable Respiratory status: spontaneous breathing and nonlabored ventilation Cardiovascular status: blood pressure returned to baseline Postop Assessment: no apparent nausea or vomiting Anesthetic complications: no   No complications documented.  Ebony Rickel [employer]

## 2020-03-29 ENCOUNTER — Encounter: Payer: Self-pay | Admitting: Ophthalmology

## 2020-04-20 ENCOUNTER — Other Ambulatory Visit: Payer: Self-pay | Admitting: Nephrology

## 2020-04-20 ENCOUNTER — Other Ambulatory Visit (HOSPITAL_COMMUNITY): Payer: Self-pay | Admitting: Nephrology

## 2020-04-20 DIAGNOSIS — R8290 Unspecified abnormal findings in urine: Secondary | ICD-10-CM

## 2020-04-20 DIAGNOSIS — N1831 Chronic kidney disease, stage 3a: Secondary | ICD-10-CM

## 2020-04-22 ENCOUNTER — Encounter (HOSPITAL_COMMUNITY): Payer: Self-pay | Admitting: Cardiology

## 2020-04-22 ENCOUNTER — Other Ambulatory Visit: Payer: Self-pay

## 2020-04-22 ENCOUNTER — Ambulatory Visit (HOSPITAL_COMMUNITY)
Admission: RE | Admit: 2020-04-22 | Discharge: 2020-04-22 | Disposition: A | Discharge status: Home / Self Care | Payer: Medicare Other | Source: Ambulatory Visit | Attending: Cardiology | Admitting: Cardiology

## 2020-04-22 VITALS — BP 120/70 | HR 55 | Wt 176.2 lb

## 2020-04-22 DIAGNOSIS — N183 Chronic kidney disease, stage 3 unspecified: Secondary | ICD-10-CM | POA: Diagnosis not present

## 2020-04-22 DIAGNOSIS — I4891 Unspecified atrial fibrillation: Secondary | ICD-10-CM | POA: Diagnosis not present

## 2020-04-22 DIAGNOSIS — Z7982 Long term (current) use of aspirin: Secondary | ICD-10-CM | POA: Insufficient documentation

## 2020-04-22 DIAGNOSIS — Z87891 Personal history of nicotine dependence: Secondary | ICD-10-CM | POA: Diagnosis not present

## 2020-04-22 DIAGNOSIS — I2510 Atherosclerotic heart disease of native coronary artery without angina pectoris: Secondary | ICD-10-CM | POA: Diagnosis not present

## 2020-04-22 DIAGNOSIS — Z8249 Family history of ischemic heart disease and other diseases of the circulatory system: Secondary | ICD-10-CM | POA: Insufficient documentation

## 2020-04-22 DIAGNOSIS — I4510 Unspecified right bundle-branch block: Secondary | ICD-10-CM | POA: Insufficient documentation

## 2020-04-22 DIAGNOSIS — E785 Hyperlipidemia, unspecified: Secondary | ICD-10-CM | POA: Insufficient documentation

## 2020-04-22 DIAGNOSIS — Z951 Presence of aortocoronary bypass graft: Secondary | ICD-10-CM | POA: Insufficient documentation

## 2020-04-22 DIAGNOSIS — Z79899 Other long term (current) drug therapy: Secondary | ICD-10-CM | POA: Insufficient documentation

## 2020-04-22 DIAGNOSIS — I129 Hypertensive chronic kidney disease with stage 1 through stage 4 chronic kidney disease, or unspecified chronic kidney disease: Secondary | ICD-10-CM | POA: Insufficient documentation

## 2020-04-22 DIAGNOSIS — Z7984 Long term (current) use of oral hypoglycemic drugs: Secondary | ICD-10-CM | POA: Insufficient documentation

## 2020-04-22 HISTORY — DX: Heart failure, unspecified: I50.9

## 2020-04-22 LAB — BASIC METABOLIC PANEL
Anion gap: 7 (ref 5–15)
BUN: 23 mg/dL (ref 8–23)
CO2: 24 mmol/L (ref 22–32)
Calcium: 8.6 mg/dL — ABNORMAL LOW (ref 8.9–10.3)
Chloride: 105 mmol/L (ref 98–111)
Creatinine, Ser: 1.26 mg/dL — ABNORMAL HIGH (ref 0.61–1.24)
GFR, Estimated: >60 mL/min (ref >60)
Glucose, Bld: 102 mg/dL — ABNORMAL HIGH (ref 70–99)
Potassium: 4 mmol/L (ref 3.5–5.1)
Sodium: 136 mmol/L (ref 135–145)

## 2020-04-22 MED ORDER — DAPAGLIFLOZIN PROPANEDIOL 10 MG PO TABS: 10.0000 mg | ORAL_TABLET | Freq: Every day | ORAL | 11 refills | Status: DC

## 2020-04-22 NOTE — Patient Instructions (Signed)
Labs done today. We will contact you only if your labs are abnormal.  START Farxiga 10mg  (1 tablet) by mouth daily.  No other medication changes were made. Please continue all current medications as prescribed.  Your physician recommends that you schedule a follow-up appointment soon for an echo,2 weeks for a lab only appointment and in 6 months. Please contact our office in August to schedule a September appointment.   Your physician has requested that you have an echocardiogram. Echocardiography is a painless test that uses sound waves to create images of your heart. It provides your doctor with information about the size and shape of your heart and how well your heart's chambers and valves are working. This procedure takes approximately one hour. There are no restrictions for this procedure.   If you have any questions or concerns before your next appointment please send [place] a message through [place] or call our office at [phone].    TO LEAVE A MESSAGE FOR THE NURSE SELECT OPTION 2, PLEASE LEAVE A MESSAGE INCLUDING: . YOUR NAME . DATE OF BIRTH . CALL BACK NUMBER . REASON FOR CALL**this is important as we prioritize the call backs  YOU WILL RECEIVE A CALL BACK THE SAME DAY AS LONG AS YOU CALL BEFORE 4:00 PM   Do the following things EVERYDAY: 1) Weigh yourself in the morning before breakfast. Write it down and keep it in a log. 2) Take your medicines as prescribed 3) Eat low salt foods-Limit salt (sodium) to 2000 mg per day.  4) Stay as active as you can everyday 5) Limit all fluids for the day to less than 2 liters   At the [facility], you and your health needs are our priority. As part of our continuing mission to provide you with exceptional heart care, we have created designated Provider Care Teams. These Care Teams include your primary Cardiologist (physician) and Advanced Practice Providers (APPs- Physician Assistants and Nurse Practitioners) who all work  together to provide you with the care you need, when you need it.   You may see any of the following providers on your designated Care Team at your next follow up: Marland Kitchen Dr Glori Bickers . Dr Loralie Champagne . Darrick Grinder, NP . Lyda Jester, PA . Audry Riles, PharmD   Please be sure to bring in all your medications bottles to every appointment.

## 2020-04-24 NOTE — Progress Notes (Signed)
Date:  04/24/2020   ID:  Peter Baxter, DOB 03-17-1950, MRN [id number]   Provider location: [place] [facility] Type of Visit: Established patient   PCP:  Medicine, [facility]  Cardiologist:  Dr. Aundra Dubin   History of Present Illness: Peter Baxter is a 70 y.o. male who had CABG in 0258 that was complicated by peri-operative atrial fibrillation that has not recurred.  He had a stress test in 2/19 that was suggestive of ischemia.  He then had a cardiac cath in 2/19.  I cannot view the report in [place] unfortunately.  The patient states that he was told 2/4 of his bypass grafts were chronically occluded but that he had grown collaterals to the vessels supplied by the occluded SVGs.  There was no intervention.  Echo in 5/19 showed EF 55-60% with mild AI.     He returns for followup of CAD.  He continues to work full time at [employer].  No chest pain.  No exertional dyspnea.  Weight is down 6 lbs.    Labs (2/19): LDL 46, HDL 46, K 4.1, creatinine 1.1 Labs (8/19): K 4.4, creatinine 1.3 Labs (9/19): LDL 50, HDL 40 Labs (2/21): LDL 56, HDl 46, K 4.6, creatinine 1.4 Labs (2/22): K 4.6, creatinine 1.5, LDL 57, HDL 44  ECG (personally reviewed): NSR, RBBB  PMH: 1. CAD: CABG in 2017 with LIMA-LAD, SVG-PDA, SVG-OM1, SVG-D.  - Stress echo (2/19) was suggestive of ischemia.  - LHC (2/19): Report not available to me on CareEverywhere.  From discussion with patient, it sounds like 2 SVGs were occluded (probably chronically) with the native vessels now supplied by collaterals, no intervention though necessary.  - Echo (5/19): EF 55-60%, mild AI.  2. HTN 3. Peri-CABG atrial fibrillation: Not anticoagulated.  4. Hyperlipidemia.  5. Left shoulder rotator cuff injury.  6. Hydrocele 7. H/o PUD 8. GERD 9. Sleep study in 5/19 with no OSA.  10. RBBB 11. CKD stage 3  Current Outpatient Medications  Medication Sig Dispense Refill  . aspirin EC 81 MG tablet Take by mouth.     Marland Kitchen atorvastatin (LIPITOR) 10 MG tablet TAKE 1 TABLET (10 MG TOTAL) BY MOUTH DAILY AT 6 PM. 90 tablet 3  . dapagliflozin propanediol (FARXIGA) 10 MG TABS tablet Take 1 tablet (10 mg total) by mouth daily before breakfast. 30 tablet 11  . lisinopril (ZESTRIL) 5 MG tablet Take 1 tablet (5 mg total) by mouth at bedtime. 90 tablet 1  . metoprolol succinate (TOPROL-XL) 25 MG 24 hr tablet TAKE 1 TABLET BY MOUTH EVERYDAY AT BEDTIME 90 tablet 1  . pantoprazole (PROTONIX) 40 MG tablet Take 1 tablet (40 mg total) by mouth daily. TAKE 1 TABLET BY MOUTH ONCE DAILY 30 tablet 5   No current facility-administered medications for this encounter.    Allergies:   Patient has no known allergies.   Social History:  The patient  reports that he has quit smoking. His smoking use included cigarettes. He has a 20.00 pack-year smoking history. He quit smokeless tobacco use about 26 years ago. He reports that he does not drink alcohol and does not use drugs.   Family History:  The patient's family history includes Heart disease in his brother, father, and mother.   ROS:  Please see the history of present illness.   All other systems are personally reviewed and negative.   Exam:  BP 120/70   Pulse (!) 55   Wt 79.9 kg (176 lb  3.2 oz)   SpO2 96%   BMI 26.79 kg/m  General: NAD Neck: No JVD, no thyromegaly or thyroid nodule.  Lungs: Clear to auscultation bilaterally with normal respiratory effort. CV: Nondisplaced PMI.  Heart regular S1/S2, no S3/S4, no murmur.  Trace ankle edema.  No carotid bruit.  Normal pedal pulses.  Abdomen: Soft, nontender, no hepatosplenomegaly, no distention.  Skin: Intact without lesions or rashes.  Neurologic: Alert and oriented x 3.  Psych: Normal affect. Extremities: No clubbing or cyanosis.  HEENT: Normal.     Recent Labs: 04/22/2020: BUN 23; Creatinine, Ser 1.26; Potassium 4.0; Sodium 136  Personally reviewed   Wt Readings from Last 3 Encounters:  04/22/20 79.9 kg (176 lb  3.2 oz)  03/28/20 78.9 kg (174 lb)  03/14/20 80.3 kg (177 lb)      ASSESSMENT AND PLAN:  1. CAD: s/p CABG x 4 in 2017.  Had atypical symptoms but abnormal stress echo in 2/19.  This was followed by cath showing 2/4 grafts occluded (presumably SVGs).  He was told that the vessels supplied by the occluded grafts had collaterals.  Echo in 5/19 showed EF 55-60%.  He has no chest pain or exertional dyspnea.   - Continue ASA 81 - Continue atorvastatin, good lipids in 2/22.  - Continue Toprol XL.  - Continue lisinopril 5 mg daily.  - Echo to reaseess LV function in setting of chronic CAD.  2. Hyperlipidemia: good lipids in 2/22.  3. Atrial fibrillation: As far as I can tell, only noted peri-op CABG.  If recurs, will need anticoagulation.  4. HTN: BP controlled. No OSA on 5/19 sleep study.  5. CKD stage 3: I will start him on Farxiga 10 mg daily.  BMET today and again in 10 days.   Followup in 6 months    Signed, Loralie Champagne, MD  04/24/2020   [facility] [address] [facility] and [place] [address] [phone] (office) [phone] (fax)

## 2020-05-05 ENCOUNTER — Ambulatory Visit: Admission: RE | Admit: 2020-05-05 | Payer: Medicare Other | Source: Ambulatory Visit

## 2020-05-20 ENCOUNTER — Other Ambulatory Visit: Payer: Self-pay

## 2020-05-20 ENCOUNTER — Ambulatory Visit (HOSPITAL_COMMUNITY)
Admission: RE | Admit: 2020-05-20 | Discharge: 2020-05-20 | Disposition: A | Discharge status: Home / Self Care | Payer: Medicare Other | Source: Ambulatory Visit | Attending: Cardiology | Admitting: Cardiology

## 2020-05-20 DIAGNOSIS — I700 Atherosclerosis of aorta: Secondary | ICD-10-CM | POA: Insufficient documentation

## 2020-05-20 DIAGNOSIS — E119 Type 2 diabetes mellitus without complications: Secondary | ICD-10-CM | POA: Insufficient documentation

## 2020-05-20 DIAGNOSIS — I351 Nonrheumatic aortic (valve) insufficiency: Secondary | ICD-10-CM | POA: Insufficient documentation

## 2020-05-20 DIAGNOSIS — I10 Essential (primary) hypertension: Secondary | ICD-10-CM | POA: Diagnosis not present

## 2020-05-20 DIAGNOSIS — E785 Hyperlipidemia, unspecified: Secondary | ICD-10-CM | POA: Diagnosis not present

## 2020-05-20 DIAGNOSIS — I2510 Atherosclerotic heart disease of native coronary artery without angina pectoris: Secondary | ICD-10-CM | POA: Diagnosis not present

## 2020-05-20 DIAGNOSIS — Z951 Presence of aortocoronary bypass graft: Secondary | ICD-10-CM | POA: Insufficient documentation

## 2020-05-20 LAB — ECHOCARDIOGRAM COMPLETE
Area-P 1/2: 3.51 cm2
P 1/2 time: 540 msec
S' Lateral: 3.6 cm

## 2020-05-20 NOTE — Progress Notes (Signed)
  Echocardiogram 2D Echocardiogram has been performed.  Peter Baxter 05/20/2020, 3:54 PM

## 2020-06-11 ENCOUNTER — Other Ambulatory Visit (HOSPITAL_COMMUNITY): Payer: Self-pay | Admitting: Cardiology

## 2020-06-16 ENCOUNTER — Other Ambulatory Visit: Payer: Self-pay

## 2020-06-16 ENCOUNTER — Ambulatory Visit
Admission: RE | Admit: 2020-06-16 | Discharge: 2020-06-16 | Disposition: A | Discharge status: Home / Self Care | Payer: Medicare Other | Source: Ambulatory Visit | Attending: Nephrology | Admitting: Nephrology

## 2020-06-16 DIAGNOSIS — R8290 Unspecified abnormal findings in urine: Secondary | ICD-10-CM | POA: Insufficient documentation

## 2020-06-16 DIAGNOSIS — N1831 Chronic kidney disease, stage 3a: Secondary | ICD-10-CM | POA: Insufficient documentation

## 2020-06-17 ENCOUNTER — Other Ambulatory Visit (HOSPITAL_COMMUNITY): Payer: Self-pay | Admitting: Cardiology

## 2020-12-06 ENCOUNTER — Other Ambulatory Visit (HOSPITAL_COMMUNITY): Payer: Self-pay | Admitting: Cardiology

## 2021-07-25 ENCOUNTER — Other Ambulatory Visit (HOSPITAL_COMMUNITY): Payer: Self-pay | Admitting: Cardiology

## 2022-01-03 IMAGING — US US RENAL
1 series · 14 of 25 positions shown · non-contrast
Comparison: CT abdomen and pelvis 01/06/2013

CLINICAL DATA: Proteinuria, grade 3a chronic kidney disease
coronary artery disease, CHF, hypertension

EXAM:
RENAL / URINARY TRACT ULTRASOUND COMPLETE

[Series 1: us renal · 0.22mm/px · 14 of 50 slices shown]
[im 1/50]
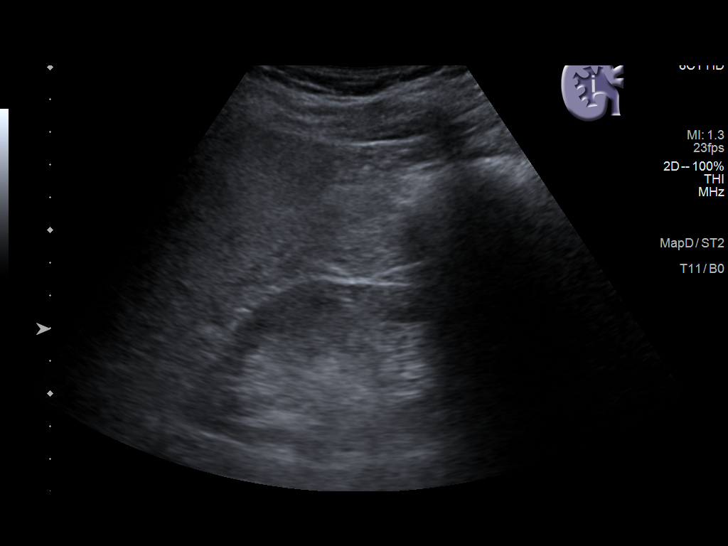
[im 5/50]
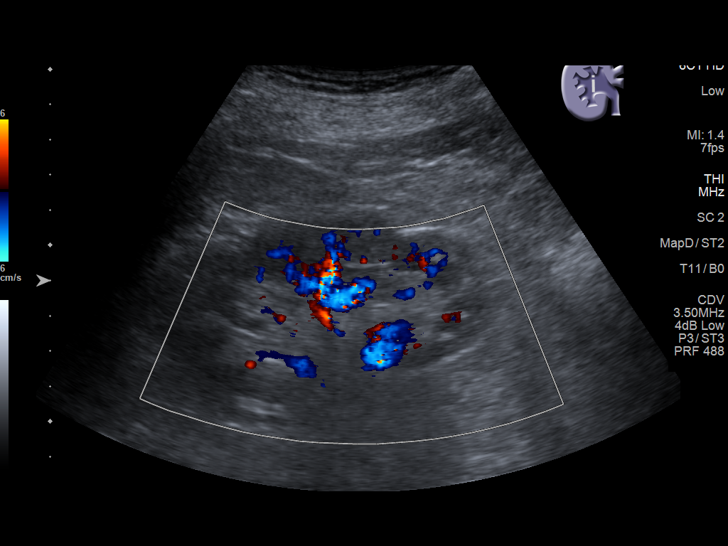
[im 9/50]
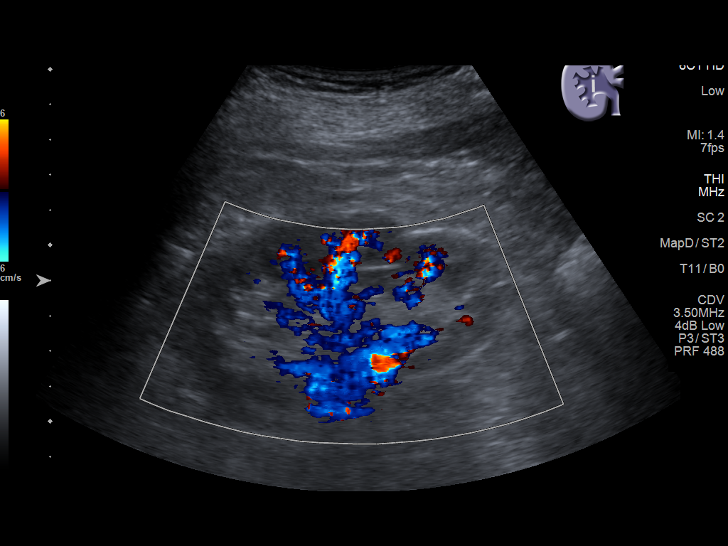
[im 13/50]
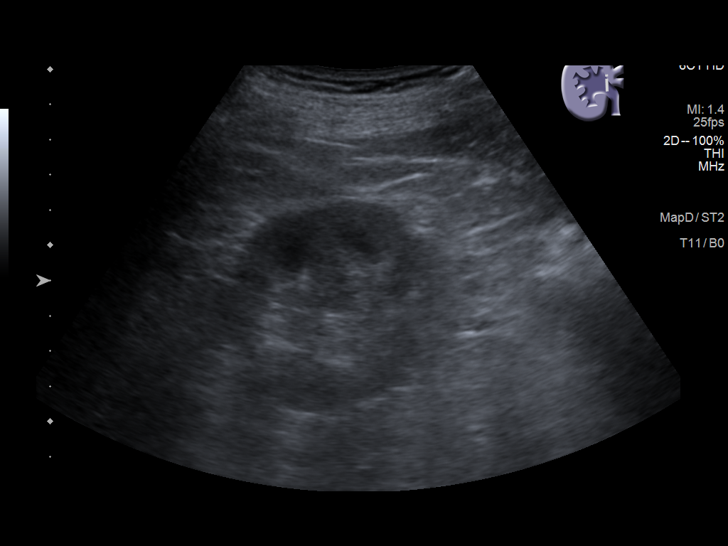
[im 17/50]
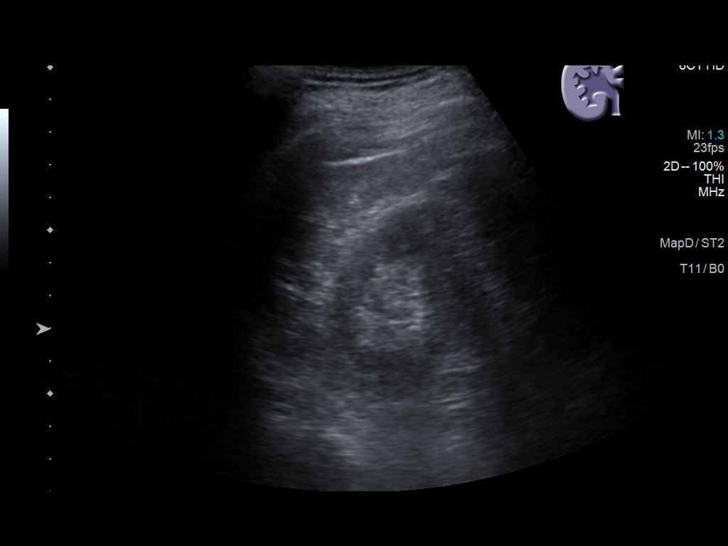
[im 19/50]
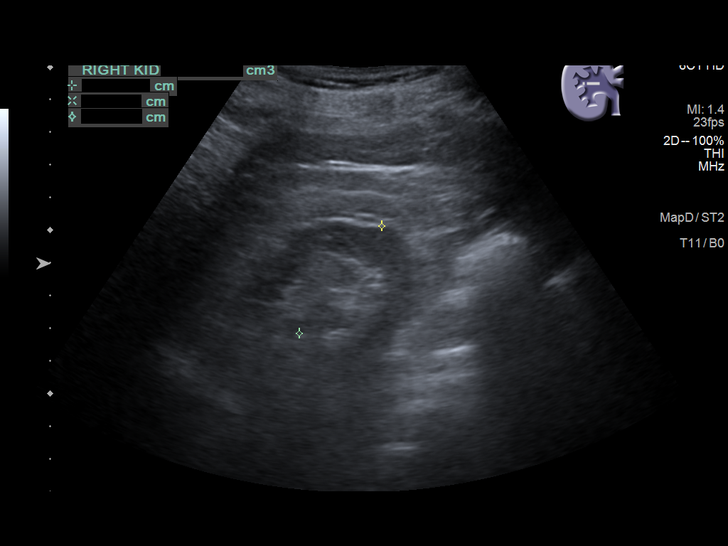
[im 23/50]
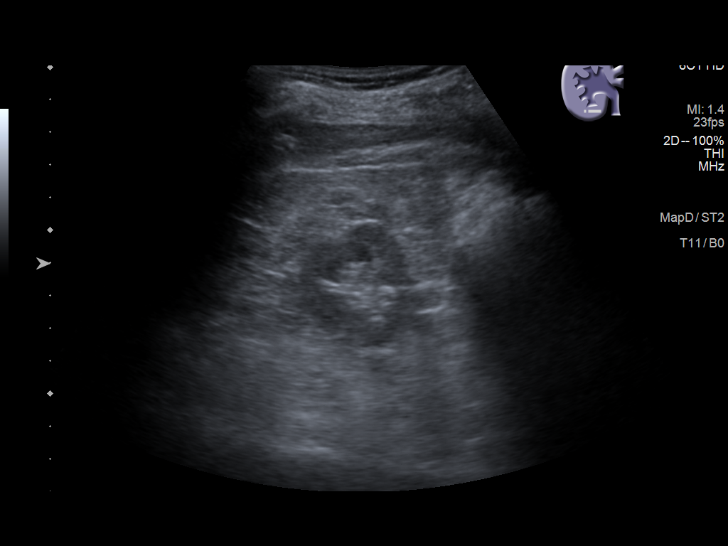
[im 27/50]
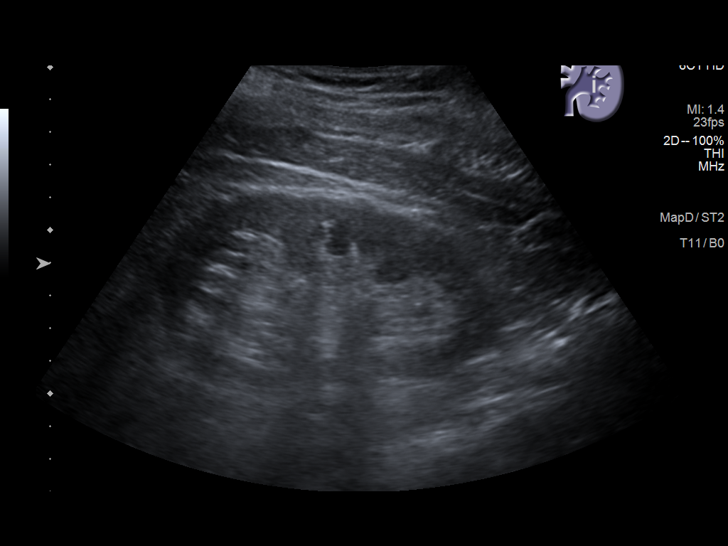
[im 31/50]
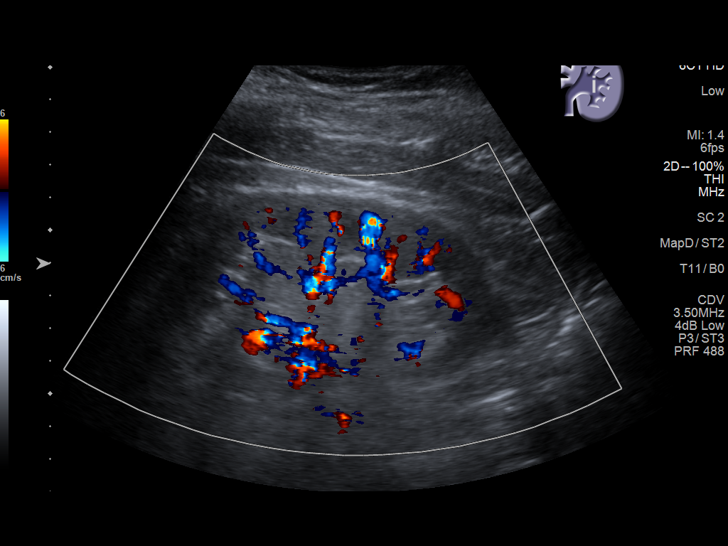
[im 33/50]
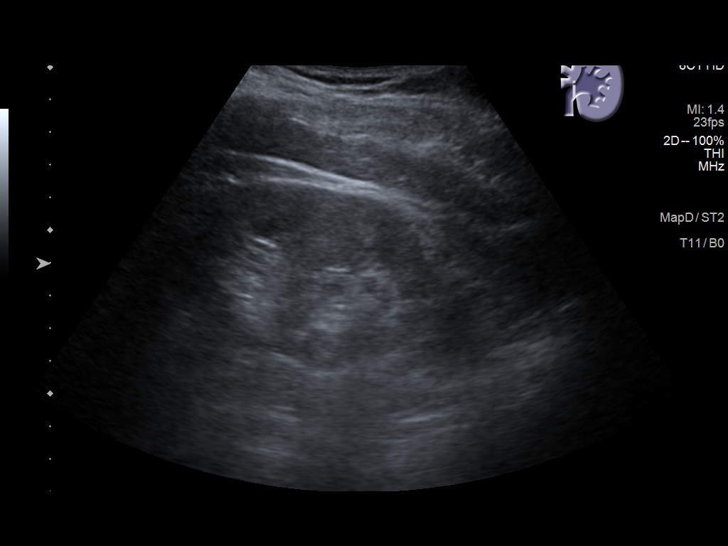
[im 37/50]
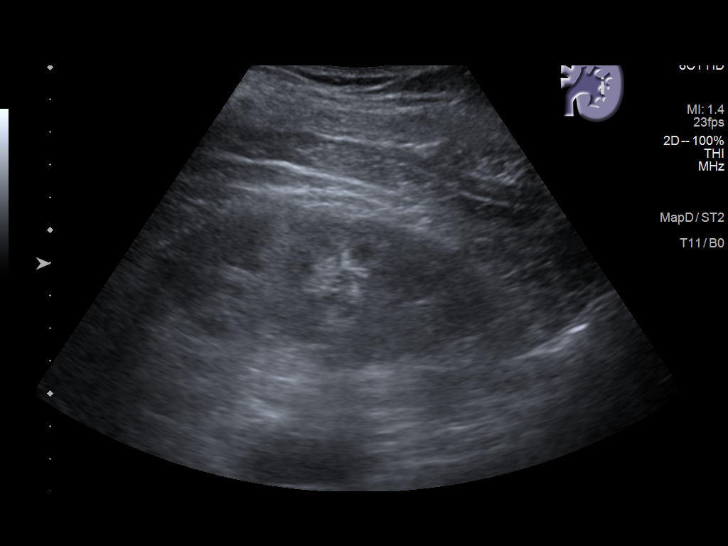
[im 41/50]
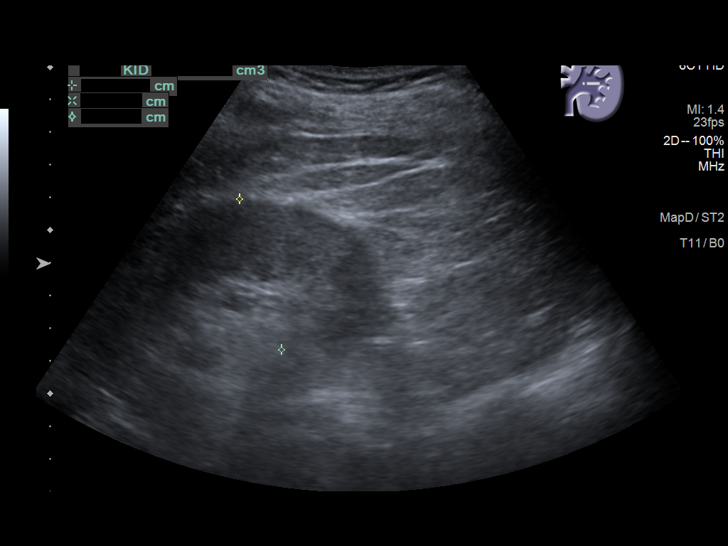
[im 45/50]
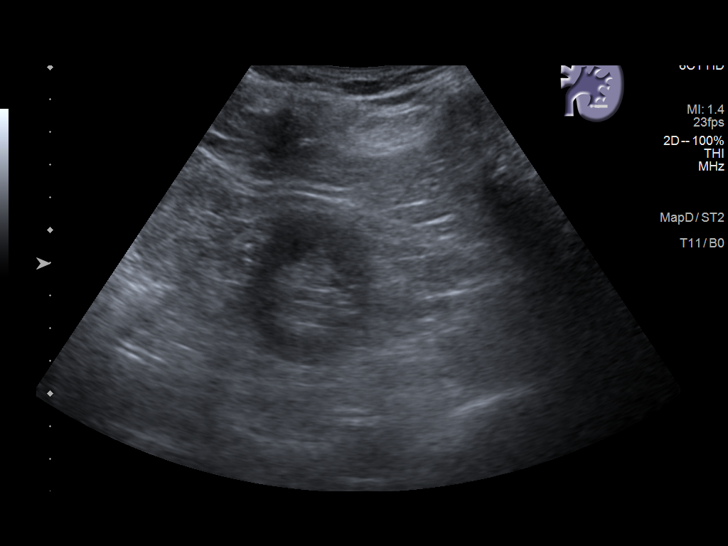
[im 50/50]
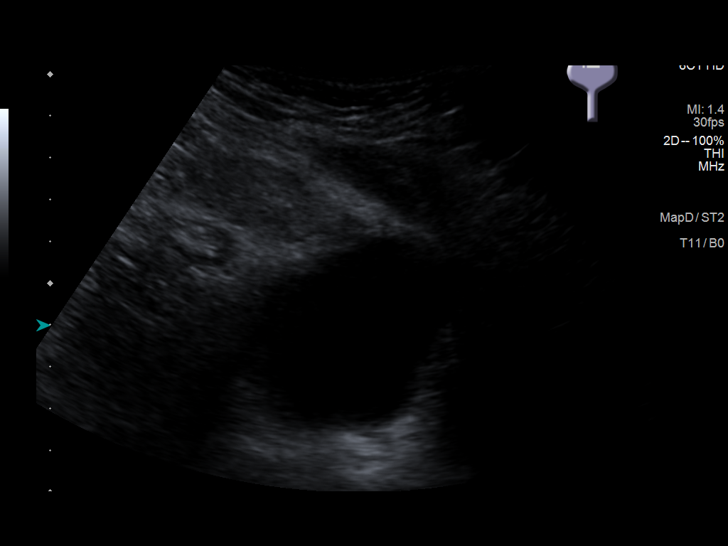

[14 of 25 positions shown; findings below may reference images not displayed]

FINDINGS: Right Kidney:

Renal measurements: 10.2 x 5.2 x 4.1 cm = volume: 114 mL. Cortical
thinning. Increased cortical echogenicity. No mass, hydronephrosis,
or shadowing calcification.

Left Kidney:

Renal measurements: 10.8 x 6.0 x 4.8 cm = volume: 161 mL. Cortical
thinning. Increased cortical echogenicity. No mass, hydronephrosis,
or shadowing calcification.

Bladder:

Appears normal for degree of bladder distention.

Other:

N/A
IMPRESSION: Cortical thinning and medical renal disease changes of both kidneys.

No evidence of renal mass or hydronephrosis.

## 2022-08-02 ENCOUNTER — Encounter: Payer: Self-pay | Admitting: Gastroenterology

## 2022-08-04 ENCOUNTER — Other Ambulatory Visit (HOSPITAL_COMMUNITY): Payer: Self-pay | Admitting: Cardiology

## 2022-08-09 ENCOUNTER — Encounter: Payer: Self-pay | Admitting: Gastroenterology

## 2022-08-10 ENCOUNTER — Ambulatory Visit: Payer: Medicare Other | Admitting: Anesthesiology

## 2022-08-10 ENCOUNTER — Ambulatory Visit
Admission: RE | Admit: 2022-08-10 | Discharge: 2022-08-10 | Disposition: A | Discharge status: Home / Self Care | Payer: Medicare Other | Attending: Gastroenterology | Admitting: Gastroenterology

## 2022-08-10 ENCOUNTER — Encounter
Admission: RE | Disposition: A | Discharge status: Home / Self Care | Payer: Self-pay | Source: Home / Self Care | Attending: Gastroenterology

## 2022-08-10 ENCOUNTER — Encounter: Payer: Self-pay | Admitting: Gastroenterology

## 2022-08-10 DIAGNOSIS — K5730 Diverticulosis of large intestine without perforation or abscess without bleeding: Secondary | ICD-10-CM | POA: Insufficient documentation

## 2022-08-10 DIAGNOSIS — I2510 Atherosclerotic heart disease of native coronary artery without angina pectoris: Secondary | ICD-10-CM | POA: Insufficient documentation

## 2022-08-10 DIAGNOSIS — K621 Rectal polyp: Secondary | ICD-10-CM | POA: Insufficient documentation

## 2022-08-10 DIAGNOSIS — K640 First degree hemorrhoids: Secondary | ICD-10-CM | POA: Diagnosis not present

## 2022-08-10 DIAGNOSIS — Z7984 Long term (current) use of oral hypoglycemic drugs: Secondary | ICD-10-CM | POA: Insufficient documentation

## 2022-08-10 DIAGNOSIS — N189 Chronic kidney disease, unspecified: Secondary | ICD-10-CM | POA: Diagnosis not present

## 2022-08-10 DIAGNOSIS — D123 Benign neoplasm of transverse colon: Secondary | ICD-10-CM | POA: Diagnosis not present

## 2022-08-10 DIAGNOSIS — K635 Polyp of colon: Secondary | ICD-10-CM | POA: Diagnosis not present

## 2022-08-10 DIAGNOSIS — I509 Heart failure, unspecified: Secondary | ICD-10-CM | POA: Insufficient documentation

## 2022-08-10 DIAGNOSIS — E7800 Pure hypercholesterolemia, unspecified: Secondary | ICD-10-CM | POA: Insufficient documentation

## 2022-08-10 DIAGNOSIS — K219 Gastro-esophageal reflux disease without esophagitis: Secondary | ICD-10-CM | POA: Insufficient documentation

## 2022-08-10 DIAGNOSIS — I4891 Unspecified atrial fibrillation: Secondary | ICD-10-CM | POA: Diagnosis not present

## 2022-08-10 DIAGNOSIS — Z79899 Other long term (current) drug therapy: Secondary | ICD-10-CM | POA: Insufficient documentation

## 2022-08-10 DIAGNOSIS — Z86010 Personal history of colonic polyps: Secondary | ICD-10-CM | POA: Diagnosis not present

## 2022-08-10 DIAGNOSIS — Z1211 Encounter for screening for malignant neoplasm of colon: Secondary | ICD-10-CM | POA: Diagnosis present

## 2022-08-10 DIAGNOSIS — Z955 Presence of coronary angioplasty implant and graft: Secondary | ICD-10-CM | POA: Diagnosis not present

## 2022-08-10 DIAGNOSIS — I130 Hypertensive heart and chronic kidney disease with heart failure and stage 1 through stage 4 chronic kidney disease, or unspecified chronic kidney disease: Secondary | ICD-10-CM | POA: Insufficient documentation

## 2022-08-10 DIAGNOSIS — Z951 Presence of aortocoronary bypass graft: Secondary | ICD-10-CM | POA: Diagnosis not present

## 2022-08-10 HISTORY — DX: Benign prostatic hyperplasia without lower urinary tract symptoms: N40.0

## 2022-08-10 HISTORY — PX: COLONOSCOPY: SHX5424

## 2022-08-10 HISTORY — DX: Gastro-esophageal reflux disease without esophagitis: K21.9

## 2022-08-10 HISTORY — DX: Benign neoplasm of colon, unspecified: D12.6

## 2022-08-10 HISTORY — DX: Malignant (primary) neoplasm, unspecified: C80.1

## 2022-08-10 HISTORY — DX: Chronic kidney disease, unspecified: N18.9

## 2022-08-10 HISTORY — PX: POLYPECTOMY: SHX5525

## 2022-08-10 SURGERY — COLONOSCOPY
Anesthesia: General

## 2022-08-10 MED ORDER — PROPOFOL 10 MG/ML IV BOLUS
INTRAVENOUS | Status: DC | PRN
  Administered 2022-08-10: 80 mg via INTRAVENOUS
  Administered 2022-08-10: 20 mg via INTRAVENOUS

## 2022-08-10 MED ORDER — PROPOFOL 1000 MG/100ML IV EMUL
INTRAVENOUS | Status: AC
  Filled 2022-08-10: qty 100

## 2022-08-10 MED ORDER — SODIUM CHLORIDE 0.9 % IV SOLN
INTRAVENOUS | Status: DC
  Administered 2022-08-10: 20 mL/h via INTRAVENOUS

## 2022-08-10 MED ORDER — LIDOCAINE HCL (CARDIAC) PF 100 MG/5ML IV SOSY
PREFILLED_SYRINGE | INTRAVENOUS | Status: DC | PRN
  Administered 2022-08-10: 20 mg via INTRAVENOUS

## 2022-08-10 MED ORDER — STERILE WATER FOR IRRIGATION IR SOLN
Status: DC | PRN
  Administered 2022-08-10: 60 mL

## 2022-08-10 MED ORDER — LIDOCAINE HCL (PF) 2 % IJ SOLN
INTRAMUSCULAR | Status: AC
  Filled 2022-08-10: qty 5

## 2022-08-10 MED ORDER — PROPOFOL 500 MG/50ML IV EMUL
INTRAVENOUS | Status: DC | PRN
  Administered 2022-08-10: 125 ug/kg/min via INTRAVENOUS

## 2022-08-10 MED ORDER — GLYCOPYRROLATE 0.2 MG/ML IJ SOLN
INTRAMUSCULAR | Status: AC
  Filled 2022-08-10: qty 1

## 2022-08-10 NOTE — H&P (Signed)
Pre-Procedure H&P   Patient ID: Peter Baxter is a 72 y.o. male.  Gastroenterology Provider: Jaynie Collins, DO  Referring Provider: Fransico Setters, NP PCP: Peter Ivan, MD  Date: 08/10/2022  HPI [name] is a 72 y.o. male who presents today for Colonoscopy for Surveillance-personal history of colon polyps .  Last underwent colonoscopy in September 2018 with 1 tubular adenoma, hyperplastic polyps and internal hemorrhoids.  Reports normal BMs without melena or hematochezia  No family history of colon cancer or colon polyps  Creatinine 1.3 hemoglobin 14.3 MCV 97 platelets 177,000   Past Medical History:  Diagnosis Date   Adenomatous polyp of colon    AF (atrial fibrillation) (HCC)    after shoulder surgery in 2017 none since   Anemia    pt denies   Arthritis    hands   Benign prostatic hyperplasia    CAD (coronary artery disease)    CAD (coronary artery disease)    Cancer (HCC)    Carotid artery stenosis    CHF (congestive heart failure) (HCC)    Chronic kidney disease    Complication of anesthesia    atrial fib after shoulder surgery 2017  after got to room none since   Dental bridge present    doesnt wear   Duodenal bulb ulcer    Esophageal reflux    Gastropathy    GERD (gastroesophageal reflux disease)    Glaucoma    both  eyes   Heart disease    Hepatitis B    History of neck problems    Hypercholesterolemia    Hypertension    Inguinal pain    Pre-diabetes    Rotator cuff tear    left shoulder    Past Surgical History:  Procedure Laterality Date   angioplasty and stent     cad     CARDIAC CATHETERIZATION  2009   3 heart stents 97 and 99   CATARACT EXTRACTION W/PHACO Left 03/14/2020   Procedure: CATARACT EXTRACTION PHACO AND INTRAOCULAR LENS PLACEMENT (IOC) LEFT ISTENT INJ 5.63 00:37.5;  Surgeon: Nevada Crane, MD;  Location: [facility];  Service: Ophthalmology;  Laterality: Left;   CATARACT EXTRACTION W/PHACO  Right 03/28/2020   Procedure: CATARACT EXTRACTION PHACO AND INTRAOCULAR LENS PLACEMENT (IOC) RIGHT ISTENT INJ;  Surgeon: Nevada Crane, MD;  Location: [facility];  Service: Ophthalmology;  Laterality: Right;  3.68 0:34.4   cc/stents     COLONOSCOPY WITH PROPOFOL N/A 11/07/2016   Procedure: COLONOSCOPY WITH PROPOFOL;  Surgeon: Scot Jun, MD;  Location: [facility];  Service: Endoscopy;  Laterality: N/A;   COLONOSCOPY, ESOPHAGOGASTRODUODENOSCOPY (EGD) AND ESOPHAGEAL DILATION     CORONARY ANGIOPLASTY  03/2017   CORONARY ARTERY BYPASS GRAFT  08/2015   ESOPHAGOGASTRODUODENOSCOPY (EGD) WITH PROPOFOL N/A 11/07/2016   Procedure: ESOPHAGOGASTRODUODENOSCOPY (EGD) WITH PROPOFOL;  Surgeon: Scot Jun, MD;  Location: [facility];  Service: Endoscopy;  Laterality: N/A;   HYDROCELE EXCISION Bilateral 12/20/2017   Procedure: HYDROCELECTOMY ADULT;  Surgeon: Ihor Gully, MD;  Location: [facility];  Service: Urology;  Laterality: Bilateral;   stents N/A 2005   x 4    TONSILLECTOMY     VASCULAR SURGERY      Family History No h/o GI disease or malignancy  Review of Systems  Constitutional:  Negative for activity change, appetite change, chills, diaphoresis, fatigue, fever and unexpected weight change.  HENT:  Negative for trouble swallowing and voice change.   Respiratory:  Negative for shortness of breath and wheezing.   Cardiovascular:  Negative for chest pain, palpitations and leg swelling.  Gastrointestinal:  Negative for abdominal distention, abdominal pain, anal bleeding, blood in stool, constipation, diarrhea, nausea and vomiting.  Musculoskeletal:  Negative for arthralgias and myalgias.  Skin:  Negative for color change and pallor.  Neurological:  Negative for dizziness, syncope and weakness.  Psychiatric/Behavioral:  Negative for confusion. The patient is not nervous/anxious.   All other systems reviewed and are negative.    Medications No  current facility-administered medications on file prior to encounter.   Current Outpatient Medications on File Prior to Encounter  Medication Sig Dispense Refill   aspirin EC 81 MG tablet Take by mouth.     dapagliflozin propanediol (FARXIGA) 10 MG TABS tablet Take 1 tablet (10 mg total) by mouth daily before breakfast. 30 tablet 11   lisinopril (ZESTRIL) 5 MG tablet TAKE 1 TABLET BY MOUTH EVERYDAY AT BEDTIME 90 tablet 3   metoprolol succinate (TOPROL-XL) 25 MG 24 hr tablet TAKE 1 TABLET BY MOUTH EVERYDAY AT BEDTIME 90 tablet 3   pantoprazole (PROTONIX) 40 MG tablet Take 1 tablet (40 mg total) by mouth daily. TAKE 1 TABLET BY MOUTH ONCE DAILY 30 tablet 5    Pertinent medications related to GI and procedure were reviewed by me with the patient prior to the procedure   Current Facility-Administered Medications:    0.9 %  sodium chloride infusion, , Intravenous, Continuous, Jaynie Collins, DO, Last Rate: 20 mL/hr at 08/10/22 0718, 20 mL/hr at 08/10/22 0718  sodium chloride 20 mL/hr (08/10/22 0718)       No Known Allergies Allergies were reviewed by me prior to the procedure  Objective   Body mass index is 26.16 kg/m. Vitals:   08/10/22 0703  BP: 139/64  Pulse: (!) 55  Resp: 18  Temp: (!) 96.8 F (36 C)  TempSrc: Temporal  SpO2: 98%  Weight: 79.2 kg  Height: 5' 8.5" (1.74 m)     Physical Exam Vitals and nursing note reviewed.  Constitutional:      General: He is not in acute distress.    Appearance: Normal appearance. He is not ill-appearing, toxic-appearing or diaphoretic.  HENT:     Head: Normocephalic and atraumatic.     Nose: Nose normal.     Mouth/Throat:     Mouth: Mucous membranes are moist.     Pharynx: Oropharynx is clear.  Eyes:     General: No scleral icterus.    Extraocular Movements: Extraocular movements intact.  Cardiovascular:     Rate and Rhythm: Regular rhythm. Bradycardia present.     Heart sounds: Normal heart sounds. No murmur heard.     No friction rub. No gallop.  Pulmonary:     Effort: Pulmonary effort is normal. No respiratory distress.     Breath sounds: Normal breath sounds. No wheezing, rhonchi or rales.  Abdominal:     General: Abdomen is flat. Bowel sounds are normal. There is no distension.     Palpations: Abdomen is soft.     Tenderness: There is no abdominal tenderness. There is no guarding or rebound.  Musculoskeletal:     Cervical back: Neck supple.     Right lower leg: No edema.     Left lower leg: No edema.  Skin:    General: Skin is warm and dry.     Coloration: Skin is not jaundiced or pale.  Neurological:     General: No focal deficit present.  Mental Status: He is alert and oriented to person, place, and time. Mental status is at baseline.  Psychiatric:        Mood and Affect: Mood normal.        Behavior: Behavior normal.        Thought Content: Thought content normal.        Judgment: Judgment normal.      Assessment:  [name] is a 72 y.o. male  who presents today for Colonoscopy for Surveillance-personal history of colon polyps .  Plan:  Colonoscopy with possible intervention today  Colonoscopy with possible biopsy, control of bleeding, polypectomy, and interventions as necessary has been discussed with the patient/patient representative. Informed consent was obtained from the patient/patient representative after explaining the indication, nature, and risks of the procedure including but not limited to death, bleeding, perforation, missed neoplasm/lesions, cardiorespiratory compromise, and reaction to medications. Opportunity for questions was given and appropriate answers were provided. Patient/patient representative has verbalized understanding is amenable to undergoing the procedure.   Jaynie Collins, DO  [facility]  Portions of the record may have been created with voice recognition software. Occasional wrong-word or 'sound-a-like'  substitutions may have occurred due to the inherent limitations of voice recognition software.  Read the chart carefully and recognize, using context, where substitutions may have occurred.

## 2022-08-10 NOTE — Anesthesia Preprocedure Evaluation (Addendum)
Anesthesia Evaluation  Patient identified by MRN, date of birth, ID band Patient awake    Reviewed: Allergy & Precautions, NPO status , Patient's Chart, lab work & pertinent test results  Airway Mallampati: II  TM Distance: >3 FB Neck ROM: full    Dental  (+) Teeth Intact   Pulmonary Patient abstained from smoking., former smoker   Pulmonary exam normal breath sounds clear to auscultation       Cardiovascular Exercise Tolerance: Good hypertension, Pt. on medications + Cardiac Stents and + CABG  Normal cardiovascular exam Rhythm:Regular Rate:Normal     Neuro/Psych negative neurological ROS  negative psych ROS   GI/Hepatic negative GI ROS, PUD,GERD  Medicated,,(+) Hepatitis -, B  Endo/Other  negative endocrine ROS    Renal/GU negative Renal ROS  negative genitourinary   Musculoskeletal  (+) Arthritis ,    Abdominal  (+) + obese  Peds negative pediatric ROS (+)  Hematology negative hematology ROS (+)   Anesthesia Other Findings Past Medical History: No date: Adenomatous polyp of colon No date: AF (atrial fibrillation) (HCC)     Comment:  after shoulder surgery in 2017 none since No date: Anemia     Comment:  pt denies No date: Arthritis     Comment:  hands No date: Benign prostatic hyperplasia No date: CAD (coronary artery disease) No date: CAD (coronary artery disease) No date: Cancer (HCC) No date: Carotid artery stenosis No date: CHF (congestive heart failure) (HCC) No date: Chronic kidney disease No date: Complication of anesthesia     Comment:  atrial fib after shoulder surgery 2017  after got to               room none since No date: Dental bridge present     Comment:  doesnt wear No date: Duodenal bulb ulcer No date: Esophageal reflux No date: Gastropathy No date: GERD (gastroesophageal reflux disease) No date: Glaucoma     Comment:  both  eyes No date: Heart disease No date: Hepatitis B No  date: History of neck problems No date: Hypercholesterolemia No date: Hypertension No date: Inguinal pain No date: Pre-diabetes No date: Rotator cuff tear     Comment:  left shoulder  Past Surgical History: No date: angioplasty and stent No date: cad 2009: CARDIAC CATHETERIZATION     Comment:  3 heart stents 97 and 99 03/14/2020: CATARACT EXTRACTION W/PHACO; Left     Comment:  Procedure: CATARACT EXTRACTION PHACO AND INTRAOCULAR               LENS PLACEMENT (IOC) LEFT ISTENT INJ 5.63 00:37.5;                Surgeon: Nevada Crane, MD;  Location: [facility];  Service: Ophthalmology;  Laterality: Left; 03/28/2020: CATARACT EXTRACTION W/PHACO; Right     Comment:  Procedure: CATARACT EXTRACTION PHACO AND INTRAOCULAR               LENS PLACEMENT (IOC) RIGHT ISTENT INJ;  Surgeon: Nevada Crane, MD;  Location: [facility];                Service: Ophthalmology;  Laterality: Right;  3.68 0:34.4 No date: cc/stents 11/07/2016: COLONOSCOPY WITH PROPOFOL; N/A     Comment:  Procedure: COLONOSCOPY WITH PROPOFOL;  Surgeon: Mechele Collin,  Wilber Bihari, MD;  Location: [facility] ENDOSCOPY;  Service:               Endoscopy;  Laterality: N/A; No date: COLONOSCOPY, ESOPHAGOGASTRODUODENOSCOPY (EGD) AND ESOPHAGEAL  DILATION 03/2017: CORONARY ANGIOPLASTY 08/2015: CORONARY ARTERY BYPASS GRAFT 11/07/2016: ESOPHAGOGASTRODUODENOSCOPY (EGD) WITH PROPOFOL; N/A     Comment:  Procedure: ESOPHAGOGASTRODUODENOSCOPY (EGD) WITH               PROPOFOL;  Surgeon: Scot Jun, MD;  Location:               [facility];  Service: Endoscopy;  Laterality: N/A; 12/20/2017: HYDROCELE EXCISION; Bilateral     Comment:  Procedure: HYDROCELECTOMY ADULT;  Surgeon: Ihor Gully, MD;  Location: [facility];                Service: Urology;  Laterality: Bilateral; 2005: stents; N/A     Comment:  x 4  No date: TONSILLECTOMY No date:  VASCULAR SURGERY  BMI    Body Mass Index: 26.16 kg/m      Reproductive/Obstetrics negative OB ROS                             Anesthesia Physical Anesthesia Plan  ASA: 3  Anesthesia Plan: General   Post-op Pain Management:    Induction: Intravenous  PONV Risk Score and Plan: Propofol infusion and TIVA  Airway Management Planned: Natural Airway  Additional Equipment:   Intra-op Plan:   Post-operative Plan:   Informed Consent: I have reviewed the patients History and Physical, chart, labs and discussed the procedure including the risks, benefits and alternatives for the proposed anesthesia with the patient or authorized representative who has indicated his/her understanding and acceptance.     Dental Advisory Given  Plan Discussed with: CRNA and Surgeon  Anesthesia Plan Comments:        Anesthesia Quick Evaluation

## 2022-08-10 NOTE — Anesthesia Postprocedure Evaluation (Signed)
Anesthesia Post Note  Patient: Peter Baxter  Procedure(s) Performed: COLONOSCOPY  Patient location during evaluation: PACU Anesthesia Type: General Level of consciousness: awake Pain management: satisfactory to patient Vital Signs Assessment: post-procedure vital signs reviewed and stable Respiratory status: spontaneous breathing and nonlabored ventilation Cardiovascular status: stable Anesthetic complications: no   No notable events documented.   Last Vitals:  Vitals:   08/10/22 0750 08/10/22 0800  BP:  (!) 93/52  Pulse:    Resp:    Temp: (!) 35.9 C   SpO2:      Last Pain:  Vitals:   08/10/22 0800  TempSrc:   PainSc: 0-No pain                 VAN STAVEREN,Hubert Derstine

## 2022-08-10 NOTE — Interval H&P Note (Signed)
History and Physical Interval Note: Preprocedure H&P from 08/10/22  was reviewed and there was no interval change after seeing and examining the patient.  Written consent was obtained from the patient after discussion of risks, benefits, and alternatives. Patient has consented to proceed with Colonoscopy with possible intervention   08/10/2022 7:23 AM  Peter Baxter  has presented today for surgery, with the diagnosis of Hx of adenomatous colonic polyps (Z86.010).  The various methods of treatment have been discussed with the patient and family. After consideration of risks, benefits and other options for treatment, the patient has consented to  Procedure(s): COLONOSCOPY (N/A) as a surgical intervention.  The patient's history has been reviewed, patient examined, no change in status, stable for surgery.  I have reviewed the patient's chart and labs.  Questions were answered to the patient's satisfaction.     Jaynie Collins

## 2022-08-10 NOTE — Op Note (Signed)
[facility] Patient Name: Peter Baxter Procedure Date: 08/10/2022 7:03 AM MRN: [id number] Account #: [id number] Date of Birth: 09-13-1950 Admit Type: Outpatient Age: 72 Room: [facility] ENDO ROOM 1 Gender: Male Note Status: Finalized Instrument Name: Colonoscope [id number] Procedure:             Colonoscopy Indications:           High risk colon cancer surveillance: Personal history                         of colonic polyps Providers:             Trenda Moots, DO Referring MD:          Marisue Ivan (Referring MD) Medicines:             Monitored Anesthesia Care Complications:         No immediate complications. Estimated blood loss:                         Minimal. Procedure:             Pre-Anesthesia Assessment:                        - Prior to the procedure, a History and Physical was                         performed, and patient medications and allergies were                         reviewed. The patient is competent. The risks and                         benefits of the procedure and the sedation options and                         risks were discussed with the patient. All questions                         were answered and informed consent was obtained.                         Patient identification and proposed procedure were                         verified by the physician, the nurse, the anesthetist                         and the technician in the endoscopy suite. Mental                         Status Examination: alert and oriented. Airway                         Examination: normal oropharyngeal airway and neck                         mobility. Respiratory Examination: clear to  auscultation. CV Examination: RRR, no murmurs, no S3                         or S4. Prophylactic Antibiotics: The patient does not                         require prophylactic antibiotics. Prior                          Anticoagulants: The patient has taken no anticoagulant                         or antiplatelet agents. ASA Grade Assessment: III - A                         patient with severe systemic disease. After reviewing                         the risks and benefits, the patient was deemed in                         satisfactory condition to undergo the procedure. The                         anesthesia plan was to use monitored anesthesia care                         (MAC). Immediately prior to administration of                         medications, the patient was re-assessed for adequacy                         to receive sedatives. The heart rate, respiratory                         rate, oxygen saturations, blood pressure, adequacy of                         pulmonary ventilation, and response to care were                         monitored throughout the procedure. The physical                         status of the patient was re-assessed after the                         procedure.                        After obtaining informed consent, the colonoscope was                         passed under direct vision. Throughout the procedure,                         the patient's blood pressure, pulse, and oxygen  saturations were monitored continuously. The                         Colonoscope was introduced through the anus and                         advanced to the the terminal ileum, with                         identification of the appendiceal orifice and IC                         valve. The colonoscopy was performed without                         difficulty. The patient tolerated the procedure well.                         The quality of the bowel preparation was evaluated                         using the BBPS [facility] Bowel Preparation Scale) with                         scores of: Right Colon = 2 (minor amount of residual                         staining, small fragments  of stool and/or opaque                         liquid, but mucosa seen well), Transverse Colon = 2                         (minor amount of residual staining, small fragments of                         stool and/or opaque liquid, but mucosa seen well) and                         Left Colon = 3 (entire mucosa seen well with no                         residual staining, small fragments of stool or opaque                         liquid). The total BBPS score equals 7. The quality of                         the bowel preparation was good. The terminal ileum,                         ileocecal valve, appendiceal orifice, and rectum were                         photographed. Findings:      The terminal ileum appeared normal. Estimated blood loss: none.      Five sessile polyps were found  in the rectum (2), sigmoid colon (2) and       transverse colon (1). The polyps were 1 to 2 mm in size. These polyps       were removed with a jumbo cold forceps. Resection and retrieval were       complete. Estimated blood loss was minimal.      Multiple small-mouthed diverticula were found in the left colon.       Estimated blood loss: none.      Non-bleeding internal hemorrhoids were found during retroflexion. The       hemorrhoids were Grade I (internal hemorrhoids that do not prolapse).       Estimated blood loss: none.      The exam was otherwise without abnormality on direct and retroflexion       views.      Visualization limited by food residue at times. Lavage was attempted,       however, food particulate frequently clogged the scope prohibiting       complete clearance. Impression:            - The examined portion of the ileum was normal.                        - Five 1 to 2 mm polyps in the rectum, in the sigmoid                         colon and in the transverse colon, removed with a                         jumbo cold forceps. Resected and retrieved.                        - Diverticulosis in  the left colon.                        - Non-bleeding internal hemorrhoids.                        - The examination was otherwise normal on direct and                         retroflexion views. Recommendation:        - Patient has a contact number available for                         emergencies. The signs and symptoms of potential                         delayed complications were discussed with the patient.                         Return to normal activities tomorrow. Written                         discharge instructions were provided to the patient.                        - Discharge patient to home.                        -  Resume previous diet.                        - Continue present medications.                        - Await pathology results.                        - Repeat colonoscopy for surveillance based on                         pathology results.                        - Return to referring physician as previously                         scheduled.                        - The findings and recommendations were discussed with                         the patient. Procedure Code(s):     --- Professional ---                        [id number], Colonoscopy, flexible; with biopsy, single or                         multiple Diagnosis Code(s):     --- Professional ---                        Z86.010, Personal history of colonic polyps                        K64.0, First degree hemorrhoids                        D12.8, Benign neoplasm of rectum                        D12.5, Benign neoplasm of sigmoid colon                        D12.3, Benign neoplasm of transverse colon (hepatic                         flexure or splenic flexure)                        K57.30, Diverticulosis of large intestine without                         perforation or abscess without bleeding CPT copyright 2022 American Medical Association. All rights reserved. The codes documented in this report are  preliminary and upon coder review may  be revised to meet current compliance requirements. Attending Participation:      I personally performed the entire procedure. Elfredia Nevins, DO Jaynie Collins DO, DO 08/10/2022 8:01:09 AM This report has been signed electronically. Number of Addenda: 0 Note Initiated On: 08/10/2022 7:03 AM Scope Withdrawal  Time: 0 hours 12 minutes 39 seconds  Total Procedure Duration: 0 hours 14 minutes 48 seconds  Estimated Blood Loss:  Estimated blood loss was minimal.      [facility]

## 2022-08-10 NOTE — Transfer of Care (Signed)
Immediate Anesthesia Transfer of Care Note  Patient: Peter Baxter  Procedure(s) Performed: COLONOSCOPY  Patient Location: PACU  Anesthesia Type:General  Level of Consciousness: drowsy  Airway & Oxygen Therapy: Patient Spontanous Breathing and Patient connected to nasal cannula oxygen  Post-op Assessment: Report given to RN and Post -op Vital signs reviewed and stable  Post vital signs: Reviewed and stable  Last Vitals:  Vitals Value Taken Time  BP 104/46 08/10/22 0752  Temp 35.9 C 08/10/22 0750  Pulse 60 08/10/22 0752  Resp 19 08/10/22 0752  SpO2 97 % 08/10/22 0752  Vitals shown include unvalidated device data.  Last Pain:  Vitals:   08/10/22 0750  TempSrc: Temporal  PainSc:          Complications: No notable events documented.

## 2022-08-13 ENCOUNTER — Encounter: Payer: Self-pay | Admitting: Gastroenterology

## 2022-09-28 ENCOUNTER — Other Ambulatory Visit: Payer: Self-pay

## 2023-01-28 ENCOUNTER — Other Ambulatory Visit: Payer: Self-pay | Admitting: Orthopedic Surgery

## 2023-01-28 DIAGNOSIS — M25311 Other instability, right shoulder: Secondary | ICD-10-CM

## 2023-01-28 DIAGNOSIS — S46001A Unspecified injury of muscle(s) and tendon(s) of the rotator cuff of right shoulder, initial encounter: Secondary | ICD-10-CM

## 2023-02-18 ENCOUNTER — Other Ambulatory Visit: Payer: Medicare Other

## 2023-02-25 ENCOUNTER — Ambulatory Visit
Admission: RE | Admit: 2023-02-25 | Discharge: 2023-02-25 | Disposition: A | Discharge status: Home / Self Care | Payer: Medicare Other | Source: Ambulatory Visit | Attending: Orthopedic Surgery | Admitting: Orthopedic Surgery

## 2023-02-25 DIAGNOSIS — M25311 Other instability, right shoulder: Secondary | ICD-10-CM

## 2023-02-25 DIAGNOSIS — S46001A Unspecified injury of muscle(s) and tendon(s) of the rotator cuff of right shoulder, initial encounter: Secondary | ICD-10-CM

## 2023-06-17 ENCOUNTER — Other Ambulatory Visit: Payer: Self-pay

## 2023-06-17 ENCOUNTER — Inpatient Hospital Stay (HOSPITAL_COMMUNITY)
Admission: EM | Admit: 2023-06-17 | Discharge: 2023-06-20 | DRG: 862 | Disposition: A | Discharge status: Home / Self Care | Attending: Internal Medicine | Admitting: Internal Medicine

## 2023-06-17 ENCOUNTER — Encounter (HOSPITAL_COMMUNITY): Payer: Self-pay | Admitting: Emergency Medicine

## 2023-06-17 ENCOUNTER — Emergency Department (HOSPITAL_COMMUNITY)

## 2023-06-17 DIAGNOSIS — N1831 Chronic kidney disease, stage 3a: Secondary | ICD-10-CM | POA: Diagnosis present

## 2023-06-17 DIAGNOSIS — M19041 Primary osteoarthritis, right hand: Secondary | ICD-10-CM | POA: Diagnosis present

## 2023-06-17 DIAGNOSIS — I2510 Atherosclerotic heart disease of native coronary artery without angina pectoris: Secondary | ICD-10-CM | POA: Diagnosis present

## 2023-06-17 DIAGNOSIS — N390 Urinary tract infection, site not specified: Secondary | ICD-10-CM | POA: Diagnosis present

## 2023-06-17 DIAGNOSIS — I10 Essential (primary) hypertension: Secondary | ICD-10-CM | POA: Diagnosis present

## 2023-06-17 DIAGNOSIS — I129 Hypertensive chronic kidney disease with stage 1 through stage 4 chronic kidney disease, or unspecified chronic kidney disease: Secondary | ICD-10-CM | POA: Diagnosis present

## 2023-06-17 DIAGNOSIS — Z7982 Long term (current) use of aspirin: Secondary | ICD-10-CM

## 2023-06-17 DIAGNOSIS — Z951 Presence of aortocoronary bypass graft: Secondary | ICD-10-CM

## 2023-06-17 DIAGNOSIS — T8144XA Sepsis following a procedure, initial encounter: Secondary | ICD-10-CM | POA: Diagnosis not present

## 2023-06-17 DIAGNOSIS — N179 Acute kidney failure, unspecified: Secondary | ICD-10-CM | POA: Diagnosis present

## 2023-06-17 DIAGNOSIS — Z8711 Personal history of peptic ulcer disease: Secondary | ICD-10-CM

## 2023-06-17 DIAGNOSIS — R509 Fever, unspecified: Secondary | ICD-10-CM | POA: Diagnosis not present

## 2023-06-17 DIAGNOSIS — N401 Enlarged prostate with lower urinary tract symptoms: Secondary | ICD-10-CM | POA: Diagnosis present

## 2023-06-17 DIAGNOSIS — R3915 Urgency of urination: Secondary | ICD-10-CM | POA: Diagnosis present

## 2023-06-17 DIAGNOSIS — R338 Other retention of urine: Secondary | ICD-10-CM | POA: Diagnosis present

## 2023-06-17 DIAGNOSIS — Z79899 Other long term (current) drug therapy: Secondary | ICD-10-CM

## 2023-06-17 DIAGNOSIS — K219 Gastro-esophageal reflux disease without esophagitis: Secondary | ICD-10-CM | POA: Diagnosis present

## 2023-06-17 DIAGNOSIS — R350 Frequency of micturition: Secondary | ICD-10-CM | POA: Diagnosis present

## 2023-06-17 DIAGNOSIS — M19042 Primary osteoarthritis, left hand: Secondary | ICD-10-CM | POA: Diagnosis present

## 2023-06-17 DIAGNOSIS — Z8249 Family history of ischemic heart disease and other diseases of the circulatory system: Secondary | ICD-10-CM

## 2023-06-17 DIAGNOSIS — K760 Fatty (change of) liver, not elsewhere classified: Secondary | ICD-10-CM | POA: Diagnosis present

## 2023-06-17 DIAGNOSIS — H409 Unspecified glaucoma: Secondary | ICD-10-CM | POA: Diagnosis present

## 2023-06-17 DIAGNOSIS — R7303 Prediabetes: Secondary | ICD-10-CM | POA: Diagnosis present

## 2023-06-17 DIAGNOSIS — A4151 Sepsis due to Escherichia coli [E. coli]: Secondary | ICD-10-CM | POA: Diagnosis present

## 2023-06-17 DIAGNOSIS — B9620 Unspecified Escherichia coli [E. coli] as the cause of diseases classified elsewhere: Secondary | ICD-10-CM | POA: Diagnosis present

## 2023-06-17 DIAGNOSIS — E7800 Pure hypercholesterolemia, unspecified: Secondary | ICD-10-CM | POA: Diagnosis present

## 2023-06-17 DIAGNOSIS — F17210 Nicotine dependence, cigarettes, uncomplicated: Secondary | ICD-10-CM | POA: Diagnosis present

## 2023-06-17 DIAGNOSIS — Z955 Presence of coronary angioplasty implant and graft: Secondary | ICD-10-CM

## 2023-06-17 DIAGNOSIS — Z860101 Personal history of adenomatous and serrated colon polyps: Secondary | ICD-10-CM

## 2023-06-17 LAB — CBC WITH DIFFERENTIAL/PLATELET
Abs Immature Granulocytes: 0.02 10*3/uL (ref 0.00–0.07)
Basophils Absolute: 0.1 10*3/uL (ref 0.0–0.1)
Basophils Relative: 1 %
Eosinophils Absolute: 0 10*3/uL (ref 0.0–0.5)
Eosinophils Relative: 0 %
HCT: 41.1 % (ref 39.0–52.0)
Hemoglobin: 13.9 g/dL (ref 13.0–17.0)
Immature Granulocytes: 0 %
Lymphocytes Relative: 11 %
Lymphs Abs: 0.6 10*3/uL — ABNORMAL LOW (ref 0.7–4.0)
MCH: 32.1 pg (ref 26.0–34.0)
MCHC: 33.8 g/dL (ref 30.0–36.0)
MCV: 94.9 fL (ref 80.0–100.0)
Monocytes Absolute: 0.4 10*3/uL (ref 0.1–1.0)
Monocytes Relative: 8 %
Neutro Abs: 4.5 10*3/uL (ref 1.7–7.7)
Neutrophils Relative %: 80 %
Platelets: 127 10*3/uL — ABNORMAL LOW (ref 150–400)
RBC: 4.33 MIL/uL (ref 4.22–5.81)
RDW: 13.6 % (ref 11.5–15.5)
WBC: 5.7 10*3/uL (ref 4.0–10.5)
nRBC: 0 % (ref 0.0–0.2)

## 2023-06-17 LAB — URINALYSIS, W/ REFLEX TO CULTURE (INFECTION SUSPECTED)
Bacteria, UA: NONE SEEN
Bilirubin Urine: NEGATIVE
Glucose, UA: NEGATIVE mg/dL
Ketones, ur: NEGATIVE mg/dL
Leukocytes,Ua: NEGATIVE
Nitrite: NEGATIVE
Protein, ur: NEGATIVE mg/dL
RBC / HPF: >50 RBC/hpf (ref 0–5)
Specific Gravity, Urine: 1.017 (ref 1.005–1.030)
pH: 5 (ref 5.0–8.0)

## 2023-06-17 LAB — COMPREHENSIVE METABOLIC PANEL WITH GFR
ALT: 103 U/L — ABNORMAL HIGH (ref 0–44)
AST: 87 U/L — ABNORMAL HIGH (ref 15–41)
Albumin: 3.5 g/dL (ref 3.5–5.0)
Alkaline Phosphatase: 99 U/L (ref 38–126)
Anion gap: 13 (ref 5–15)
BUN: 22 mg/dL (ref 8–23)
CO2: 17 mmol/L — ABNORMAL LOW (ref 22–32)
Calcium: 8.6 mg/dL — ABNORMAL LOW (ref 8.9–10.3)
Chloride: 104 mmol/L (ref 98–111)
Creatinine, Ser: 1.81 mg/dL — ABNORMAL HIGH (ref 0.61–1.24)
GFR, Estimated: 39 mL/min — ABNORMAL LOW (ref >60)
Glucose, Bld: 115 mg/dL — ABNORMAL HIGH (ref 70–99)
Potassium: 4.1 mmol/L (ref 3.5–5.1)
Sodium: 134 mmol/L — ABNORMAL LOW (ref 135–145)
Total Bilirubin: 1 mg/dL (ref 0.0–1.2)
Total Protein: 6.4 g/dL — ABNORMAL LOW (ref 6.5–8.1)

## 2023-06-17 LAB — I-STAT CG4 LACTIC ACID, ED: Lactic Acid, Venous: 1.2 mmol/L (ref 0.5–1.9)

## 2023-06-17 MED ORDER — ACETAMINOPHEN 325 MG PO TABS
650.0000 mg | ORAL_TABLET | Freq: Once | ORAL | Status: AC | PRN
  Administered 2023-06-17: 650 mg via ORAL
  Filled 2023-06-17: qty 2

## 2023-06-17 NOTE — ED Triage Notes (Signed)
 Patient coming to ED for evaluation of post-op problem.  Reports he had a UroLyft on 06/11/2023.  States since he had procedure he has had urinary retention and only able to "drip and going every 15 minutes."  Had ultrasound on Tuesday and they informed him that he was emptying his bladder.  Today developed fever and having increased painful urination.

## 2023-06-18 ENCOUNTER — Emergency Department (HOSPITAL_COMMUNITY)

## 2023-06-18 ENCOUNTER — Encounter (HOSPITAL_COMMUNITY): Payer: Self-pay | Admitting: Family Medicine

## 2023-06-18 DIAGNOSIS — I2510 Atherosclerotic heart disease of native coronary artery without angina pectoris: Secondary | ICD-10-CM

## 2023-06-18 DIAGNOSIS — R5082 Postprocedural fever: Secondary | ICD-10-CM | POA: Diagnosis not present

## 2023-06-18 DIAGNOSIS — N1831 Chronic kidney disease, stage 3a: Secondary | ICD-10-CM | POA: Diagnosis present

## 2023-06-18 DIAGNOSIS — R509 Fever, unspecified: Secondary | ICD-10-CM | POA: Diagnosis present

## 2023-06-18 LAB — BASIC METABOLIC PANEL WITH GFR
Anion gap: 8 (ref 5–15)
BUN: 21 mg/dL (ref 8–23)
CO2: 22 mmol/L (ref 22–32)
Calcium: 8.2 mg/dL — ABNORMAL LOW (ref 8.9–10.3)
Chloride: 104 mmol/L (ref 98–111)
Creatinine, Ser: 1.65 mg/dL — ABNORMAL HIGH (ref 0.61–1.24)
GFR, Estimated: 44 mL/min — ABNORMAL LOW (ref >60)
Glucose, Bld: 125 mg/dL — ABNORMAL HIGH (ref 70–99)
Potassium: 4.6 mmol/L (ref 3.5–5.1)
Sodium: 134 mmol/L — ABNORMAL LOW (ref 135–145)

## 2023-06-18 LAB — HEPATIC FUNCTION PANEL
ALT: 112 U/L — ABNORMAL HIGH (ref 0–44)
AST: 92 U/L — ABNORMAL HIGH (ref 15–41)
Albumin: 3.3 g/dL — ABNORMAL LOW (ref 3.5–5.0)
Alkaline Phosphatase: 97 U/L (ref 38–126)
Bilirubin, Direct: 0.2 mg/dL (ref 0.0–0.2)
Indirect Bilirubin: 0.7 mg/dL (ref 0.3–0.9)
Total Bilirubin: 0.9 mg/dL (ref 0.0–1.2)
Total Protein: 6.1 g/dL — ABNORMAL LOW (ref 6.5–8.1)

## 2023-06-18 LAB — CBC
HCT: 41 % (ref 39.0–52.0)
Hemoglobin: 13.7 g/dL (ref 13.0–17.0)
MCH: 31.9 pg (ref 26.0–34.0)
MCHC: 33.4 g/dL (ref 30.0–36.0)
MCV: 95.6 fL (ref 80.0–100.0)
Platelets: 119 10*3/uL — ABNORMAL LOW (ref 150–400)
RBC: 4.29 MIL/uL (ref 4.22–5.81)
RDW: 14 % (ref 11.5–15.5)
WBC: 6.7 10*3/uL (ref 4.0–10.5)
nRBC: 0 % (ref 0.0–0.2)

## 2023-06-18 LAB — I-STAT CG4 LACTIC ACID, ED: Lactic Acid, Venous: 0.6 mmol/L (ref 0.5–1.9)

## 2023-06-18 MED ORDER — PROCHLORPERAZINE EDISYLATE 10 MG/2ML IJ SOLN: 5.0000 mg | Freq: Four times a day (QID) | INTRAMUSCULAR | Status: DC | PRN

## 2023-06-18 MED ORDER — ACETAMINOPHEN 650 MG RE SUPP: 650.0000 mg | Freq: Four times a day (QID) | RECTAL | Status: DC | PRN

## 2023-06-18 MED ORDER — LACTATED RINGERS IV SOLN: INTRAVENOUS | Status: AC

## 2023-06-18 MED ORDER — ACETAMINOPHEN 325 MG PO TABS
650.0000 mg | ORAL_TABLET | Freq: Four times a day (QID) | ORAL | Status: DC | PRN
  Administered 2023-06-18 (×2): 650 mg via ORAL
  Filled 2023-06-18 (×2): qty 2

## 2023-06-18 MED ORDER — ATORVASTATIN CALCIUM 10 MG PO TABS
10.0000 mg | ORAL_TABLET | Freq: Every day | ORAL | Status: DC
  Administered 2023-06-18: 10 mg via ORAL
  Filled 2023-06-18: qty 1

## 2023-06-18 MED ORDER — ALFUZOSIN HCL ER 10 MG PO TB24
10.0000 mg | ORAL_TABLET | Freq: Every day | ORAL | Status: DC
  Administered 2023-06-18 – 2023-06-20 (×3): 10 mg via ORAL
  Filled 2023-06-18 (×5): qty 1

## 2023-06-18 MED ORDER — IOHEXOL 350 MG/ML SOLN
75.0000 mL | Freq: Once | INTRAVENOUS | Status: AC | PRN
  Administered 2023-06-18: 75 mL via INTRAVENOUS

## 2023-06-18 MED ORDER — SODIUM CHLORIDE 0.9% FLUSH
3.0000 mL | Freq: Two times a day (BID) | INTRAVENOUS | Status: DC
  Administered 2023-06-18 – 2023-06-20 (×5): 3 mL via INTRAVENOUS

## 2023-06-18 MED ORDER — LACTATED RINGERS IV SOLN: INTRAVENOUS | Status: DC

## 2023-06-18 MED ORDER — ALBUTEROL SULFATE (2.5 MG/3ML) 0.083% IN NEBU: 2.5000 mg | INHALATION_SOLUTION | Freq: Four times a day (QID) | RESPIRATORY_TRACT | Status: DC | PRN

## 2023-06-18 MED ORDER — METOPROLOL SUCCINATE ER 25 MG PO TB24
25.0000 mg | ORAL_TABLET | Freq: Every day | ORAL | Status: DC
  Administered 2023-06-18 – 2023-06-19 (×2): 25 mg via ORAL
  Filled 2023-06-18 (×2): qty 1

## 2023-06-18 MED ORDER — SODIUM CHLORIDE 0.9 % IV SOLN
2.0000 g | INTRAVENOUS | Status: DC
  Administered 2023-06-19 – 2023-06-20 (×2): 2 g via INTRAVENOUS
  Filled 2023-06-18 (×2): qty 20

## 2023-06-18 MED ORDER — SODIUM CHLORIDE 0.9 % IV SOLN
1.0000 g | Freq: Once | INTRAVENOUS | Status: AC
  Administered 2023-06-18: 1 g via INTRAVENOUS
  Filled 2023-06-18: qty 10

## 2023-06-18 MED ORDER — OXYCODONE HCL 5 MG PO TABS: 2.5000 mg | ORAL_TABLET | ORAL | Status: DC | PRN

## 2023-06-18 MED ORDER — PANTOPRAZOLE SODIUM 40 MG PO TBEC
40.0000 mg | DELAYED_RELEASE_TABLET | Freq: Every day | ORAL | Status: DC
  Administered 2023-06-18 – 2023-06-20 (×3): 40 mg via ORAL
  Filled 2023-06-18 (×3): qty 1

## 2023-06-18 MED ORDER — ASPIRIN 81 MG PO TBEC
81.0000 mg | DELAYED_RELEASE_TABLET | Freq: Every day | ORAL | Status: DC
  Administered 2023-06-19 – 2023-06-20 (×2): 81 mg via ORAL
  Filled 2023-06-18 (×3): qty 1

## 2023-06-18 NOTE — Plan of Care (Signed)
  Problem: Fluid Volume: Goal: Hemodynamic stability will improve Outcome: Progressing   Problem: Respiratory: Goal: Ability to maintain adequate ventilation will improve Outcome: Progressing   Problem: Education: Goal: Knowledge of General Education information will improve Description: Including pain rating scale, medication(s)/side effects and non-pharmacologic comfort measures Outcome: Progressing   Problem: Health Behavior/Discharge Planning: Goal: Ability to manage health-related needs will improve Outcome: Progressing   Problem: Clinical Measurements: Goal: Ability to maintain clinical measurements within normal limits will improve Outcome: Progressing   Problem: Coping: Goal: Level of anxiety will decrease Outcome: Progressing   Problem: Pain Managment: Goal: General experience of comfort will improve and/or be controlled Outcome: Progressing   Problem: Safety: Goal: Ability to remain free from injury will improve Outcome: Progressing

## 2023-06-18 NOTE — ED Provider Notes (Signed)
 [facility] Emergency Department Provider Note MRN:  [id number]  Arrival date & time: 06/18/23     Chief Complaint   Post-op Problem   History of Present Illness   Peter Baxter is a 73 y.o. year-old male presents to the ED with chief complaint of fever.  States that he had recent urolift procedure done on 4/29 with Dr. Freddi Jaeger at [facility].  Reports that he has had some urinary hesitancy and hematuria.  He has been on Keflex and Bactrim for this.  He reports running a fever to 103, but denies having pain.    History provided by patient.   Review of Systems  Pertinent positive and negative review of systems noted in HPI.    Physical Exam   Vitals:   06/18/23 0145 06/18/23 0148  BP: (!) 149/85   Pulse: 80 80  Resp: 18   Temp:  99 F (37.2 C)  SpO2: 100% 100%    CONSTITUTIONAL:  non toxic-appearing, NAD NEURO:  Alert and oriented x 3, CN 3-12 grossly intact EYES:  eyes equal and reactive ENT/NECK:  Supple, no stridor  CARDIO:  normal rate, regular rhythm, appears well-perfused  PULM:  No respiratory distress, CTAB GI/GU:  non-distended,  MSK/SPINE:  No gross deformities, no edema, moves all extremities  SKIN:  no rash, atraumatic   *Additional and/or pertinent findings included in MDM below  Diagnostic and Interventional Summary    EKG Interpretation Date/Time:    Ventricular Rate:    PR Interval:    QRS Duration:    QT Interval:    QTC Calculation:   R Axis:      Text Interpretation:         Labs Reviewed  COMPREHENSIVE METABOLIC PANEL WITH GFR - Abnormal; Notable for the following components:      Result Value   Sodium 134 (*)    CO2 17 (*)    Glucose, Bld 115 (*)    Creatinine, Ser 1.81 (*)    Calcium  8.6 (*)    Total Protein 6.4 (*)    AST 87 (*)    ALT 103 (*)    GFR, Estimated 39 (*)    All other components within normal limits  CBC WITH DIFFERENTIAL/PLATELET - Abnormal; Notable for the following components:    Platelets 127 (*)    Lymphs Abs 0.6 (*)    All other components within normal limits  URINALYSIS, W/ REFLEX TO CULTURE (INFECTION SUSPECTED) - Abnormal; Notable for the following components:   Hgb urine dipstick MODERATE (*)    All other components within normal limits  URINE CULTURE  CULTURE, BLOOD (ROUTINE X 2)  CULTURE, BLOOD (ROUTINE X 2)  I-STAT CG4 LACTIC ACID, ED  I-STAT CG4 LACTIC ACID, ED    CT ABDOMEN PELVIS W CONTRAST  Final Result    DG Chest 2 View  Final Result      Medications  acetaminophen  (TYLENOL ) tablet 650 mg (650 mg Oral Given 06/17/23 2033)  cefTRIAXone (ROCEPHIN) 1 g in sodium chloride  0.9 % 100 mL IVPB (0 g Intravenous Stopped 06/18/23 0243)  iohexol (OMNIPAQUE) 350 MG/ML injection 75 mL (75 mLs Intravenous Contrast Given 06/18/23 0219)     Procedures  /  Critical Care Procedures  ED Course and Medical Decision Making  I have reviewed the triage vital signs, the nursing notes, and pertinent available records from the EMR.  Social Determinants Affecting Complexity of Care: Patient has no clinically significant social determinants affecting this chief  complaint..   ED Course:    Medical Decision Making Patient here after UroLift procedure that occurred about a week ago.  He has been running fevers at home.  He denies having significant pain.  Lactic acid is normal here.  No significant leukocytosis.  He does have a creatinine of 1.81, which is increased.  Patient has been taking Keflex and Bactrim at home.  Given his fever of 103 in triage, will check CT abdomen/pelvis.  CT reassuring, no acute infectious findings seen.  Will continue IV Rocephin.  Will consult urology for recommendations.  Amount and/or Complexity of Data Reviewed Labs: ordered. Radiology: ordered.  Risk OTC drugs. Prescription drug management. Decision regarding hospitalization.         Consultants: I consulted with Hospitalist, Dr. Brice Campi, who is appreciated for  admitting. I consulted with Dr. Cathi Cluster, who recommends admission for IV abx and suggests placing a foley catheter to help drain.   Treatment and Plan: Patient's exam and diagnostic results are concerning for fever and hemorrhagic cystitis.  Feel that patient will need admission to the hospital for further treatment and evaluation.   Final Clinical Impressions(s) / ED Diagnoses     ICD-10-CM   1. Fever, unspecified fever cause  R50.9       ED Discharge Orders     None         Discharge Instructions Discussed with and Provided to Patient:   Discharge Instructions   None      Sherel Dikes, PA-C 06/18/23 1610    Lindle Rhea, MD 06/18/23 [phone]

## 2023-06-18 NOTE — Consult Note (Signed)
 I have been asked to see the patient by Dr. Sherel Dikes, for evaluation and management of septic UTI.  History of present illness: 73 year old gentleman with a history of BPH seen at [facility] urology by Dr.Gay.  Has history of BPH with lower urinary tract symptoms he underwent UroLift on April 28.  He represented the next day with urgency and frequency.  Patient states that since surgery he has had significant frequency with urination as well as dysuria.  Over the weekend he began to have fevers.  Earlier today on presentation the ER had a fever at 103.  White count was within normal limits.  Patient was noted to have AKI with of creatinine 1.8 up from baseline.   Patient has been on Uroxatrol prior to the UroLift being completed as it is procedures done he stopped this medication.    Patient denies any nausea vomiting.   Review of systems: A 12 point comprehensive review of systems was obtained and is negative unless otherwise stated in the history of present illness.  Patient Active Problem List   Diagnosis Date Noted   Fever 06/18/2023   CKD stage 3a, GFR 45-59 ml/min (HCC) 06/18/2023   AF (paroxysmal atrial fibrillation) (HCC) 09/11/2015   Pain in joint, shoulder region 09/19/2014   Shoulder pain 06/24/2014   Encounter to establish care 06/24/2014   Borderline diabetes mellitus 11/04/2013   Essential hypertension 12/18/2012   Mixed hyperlipidemia 12/18/2012   Coronary artery disease involving native coronary artery of native heart 12/17/2012    No current facility-administered medications on file prior to encounter.   Current Outpatient Medications on File Prior to Encounter  Medication Sig Dispense Refill   albuterol (VENTOLIN HFA) 108 (90 Base) MCG/ACT inhaler Inhale 2 puffs into the lungs every 6 (six) hours as needed.     alfuzosin (UROXATRAL) 10 MG 24 hr tablet Take 10 mg by mouth daily with breakfast.     atorvastatin  (LIPITOR) 10 MG tablet Take 1 tablet (10 mg total) by  mouth daily at 6 PM. NEEDS FOLLOW UP APPOINTMENT FOR MORE REFILLS (Patient taking differently: Take 10 mg by mouth at bedtime. NEEDS FOLLOW UP APPOINTMENT FOR MORE REFILLS) 90 tablet 3   Calcium  Carb-Cholecalciferol 600-10 MG-MCG TABS Take 1 tablet by mouth 2 (two) times daily with a meal.     cyanocobalamin (VITAMIN B12) 500 MCG tablet Take 500 mcg by mouth daily.     losartan (COZAAR) 25 MG tablet Take 25 mg by mouth daily.     metoprolol  succinate (TOPROL -XL) 25 MG 24 hr tablet TAKE 1 TABLET BY MOUTH EVERYDAY AT BEDTIME 90 tablet 3   pantoprazole  (PROTONIX ) 40 MG tablet Take 1 tablet (40 mg total) by mouth daily. TAKE 1 TABLET BY MOUTH ONCE DAILY 30 tablet 5   aspirin EC 81 MG tablet Take 81 mg by mouth daily. (Patient not taking: Reported on 06/18/2023)      Past Medical History:  Diagnosis Date   Adenomatous polyp of colon    AF (atrial fibrillation) (HCC)    after shoulder surgery in 2017 none since   Anemia    pt denies   Arthritis    hands   Benign prostatic hyperplasia    CAD (coronary artery disease)    CAD (coronary artery disease)    Cancer (HCC)    Carotid artery stenosis    CHF (congestive heart failure) (HCC)    Chronic kidney disease    Complication of anesthesia    atrial fib after shoulder  surgery 2017  after got to room none since   Dental bridge present    doesnt wear   Duodenal bulb ulcer    Esophageal reflux    Gastropathy    GERD (gastroesophageal reflux disease)    Glaucoma    both  eyes   Heart disease    Hepatitis B    History of neck problems    Hypercholesterolemia    Hypertension    Inguinal pain    Pre-diabetes    Rotator cuff tear    left shoulder    Past Surgical History:  Procedure Laterality Date   angioplasty and stent     cad     CARDIAC CATHETERIZATION  2009   3 heart stents 97 and 99   CATARACT EXTRACTION W/PHACO Left 03/14/2020   Procedure: CATARACT EXTRACTION PHACO AND INTRAOCULAR LENS PLACEMENT (IOC) LEFT ISTENT INJ 5.63  00:37.5;  Surgeon: Rosa College, MD;  Location: [facility];  Service: Ophthalmology;  Laterality: Left;   CATARACT EXTRACTION W/PHACO Right 03/28/2020   Procedure: CATARACT EXTRACTION PHACO AND INTRAOCULAR LENS PLACEMENT (IOC) RIGHT ISTENT INJ;  Surgeon: Rosa College, MD;  Location: [facility];  Service: Ophthalmology;  Laterality: Right;  3.68 0:34.4   cc/stents     COLONOSCOPY N/A 08/10/2022   Procedure: COLONOSCOPY;  Surgeon: Quintin Buckle, DO;  Location: [facility] ENDOSCOPY;  Service: Gastroenterology;  Laterality: N/A;   COLONOSCOPY WITH PROPOFOL  N/A 11/07/2016   Procedure: COLONOSCOPY WITH PROPOFOL ;  Surgeon: Cassie Click, MD;  Location: [facility] ENDOSCOPY;  Service: Endoscopy;  Laterality: N/A;   COLONOSCOPY, ESOPHAGOGASTRODUODENOSCOPY (EGD) AND ESOPHAGEAL DILATION     CORONARY ANGIOPLASTY  03/2017   CORONARY ARTERY BYPASS GRAFT  08/2015   ESOPHAGOGASTRODUODENOSCOPY (EGD) WITH PROPOFOL  N/A 11/07/2016   Procedure: ESOPHAGOGASTRODUODENOSCOPY (EGD) WITH PROPOFOL ;  Surgeon: Cassie Click, MD;  Location: [facility] ENDOSCOPY;  Service: Endoscopy;  Laterality: N/A;   HYDROCELE EXCISION Bilateral 12/20/2017   Procedure: HYDROCELECTOMY ADULT;  Surgeon: Ottelin, Mark, MD;  Location: [facility];  Service: Urology;  Laterality: Bilateral;   POLYPECTOMY  08/10/2022   Procedure: POLYPECTOMY;  Surgeon: Quintin Buckle, DO;  Location: [facility]COPY;  Service: Gastroenterology;;   stents N/A 2005   x 4    TONSILLECTOMY     VASCULAR SURGERY      Social History   Tobacco Use   Smoking status: Former    Current packs/day: 1.00    Average packs/day: 1 pack/day for 20.0 years (20.0 ttl pk-yrs)    Types: Cigarettes   Smokeless tobacco: Former    Quit date: 06/12/1993   Tobacco comments:    quit 1995  Vaping Use   Vaping status: Never Used  Substance Use Topics   Alcohol use: Never   Drug use: No    Family History  Problem Relation Age of  Onset   Heart disease Mother    Heart disease Father    Heart disease Brother     PE: Vitals:   06/18/23 0530 06/18/23 0545 06/18/23 0615 06/18/23 0616  BP: 129/67 128/72 121/79   Pulse: 91 92 84   Resp: 20 (!) 24 15   Temp:    98.9 F (37.2 C)  TempSrc:    Oral  SpO2: 95% 91% 98%   Weight:      Height:       Patient appears to be in no acute distress  patient is alert and oriented x3 Respiratory: Normal work of breathing room air Cardiovascular:  Regular rate and rhythm per monitor Abdomen: Soft nontender nondistended GU: Uncircumcised penis catheter in place draining clear yellow urine no tenderness of the penis mons pubis medial thighs scrotum or perineum.  Bilateral testicles normal/in position left side has a small hydrocele that is nontender.  On exam there is no areas of concern for fluctuance crepitus or induration. Recent Labs    06/17/23 2018 06/18/23 0600  WBC 5.7 6.7  HGB 13.9 13.7  HCT 41.1 41.0   Recent Labs    06/17/23 2018 06/18/23 0600  NA 134* 134*  K 4.1 4.6  CL 104 104  CO2 17* 22  GLUCOSE 115* 125*  BUN 22 21  CREATININE 1.81* 1.65*  CALCIUM  8.6* 8.2*   No results for input(s): "LABPT", "INR" in the last 72 hours. No results for input(s): "LABURIN" in the last 72 hours. Results for orders placed or performed during the hospital encounter of 03/24/20  SARS CORONAVIRUS 2 (TAT 6-24 HRS) Nasopharyngeal Nasopharyngeal Swab     Status: None   Collection Time: 03/25/20  9:04 AM   Specimen: Nasopharyngeal Swab  Result Value Ref Range Status   SARS Coronavirus 2 NEGATIVE NEGATIVE Final    Comment: (NOTE) SARS-CoV-2 target nucleic acids are NOT DETECTED.  The SARS-CoV-2 RNA is generally detectable in upper and lower respiratory specimens during the acute phase of infection. Negative results do not preclude SARS-CoV-2 infection, do not rule out co-infections with other pathogens, and should not be used as the sole basis for treatment or other  patient management decisions. Negative results must be combined with clinical observations, patient history, and epidemiological information. The expected result is Negative.  Fact Sheet for Patients: HairSlick.no  Fact Sheet for Healthcare Providers: quierodirigir.com  This test is not yet approved or cleared by the [place]  FDA and  has been authorized for detection and/or diagnosis of SARS-CoV-2 by FDA under an Emergency Use Authorization (EUA). This EUA will remain  in effect (meaning this test can be used) for the duration of the COVID-19 declaration under Se ction 564(b)(1) of the Act, 21 U.S.C. section 360bbb-3(b)(1), unless the authorization is terminated or revoked sooner.  Performed at [facility], [address] [place], [place] [address]     Imaging: CT 5/6 IMPRESSION: 1. No acute abnormality in the abdomen or pelvis. 2. Hepatic steatosis. 3. Cholelithiasis without evidence of acute cholecystitis. 4. Colonic diverticulosis without diverticulitis. 5. Aortic Atherosclerosis (ICD10-I70.0).  Imp: 73 year old male with septic UTI after UroLift procedure.  Catheter placed patient draining well.  Recommendations: - Obtain urine culture and blood cultures - Start broad-spectrum antibiotics can tailor antibiotics to cultures once that result - Patient can be void trialed at discharge - Recommend restarting Uroxatrol/alfuzosin prior to discharge - Urology will follow peripherally   Thank you for involving me in this patient's care, Please page with any further questions or concerns. Thelbert Finner

## 2023-06-18 NOTE — H&P (Signed)
 History and Physical    ROCK RIZK XBJ:[id number] DOB: November 17, 1950 DOA: 06/17/2023  PCP: Monique Ano, MD   Patient coming from: Home   Chief Complaint: Fever, difficulty voiding   HPI: Peter Baxter is a 73 y.o. male with medical history significant for hypertension, hyperlipidemia, CAD, CKD 3A, and BPH with LUTS status post UroLift on 06/11/2023 who now presents with fever despite taking Keflex and Bactrim.   Patient reports difficulty voiding ever since his procedure a week ago.  He describes dribbling small volumes of urine every several minutes with a sensation that he is not completely emptying his bladder.  He was experiencing dysuria immediately after the procedure but that has since improved.  He developed a fever yesterday, was seen at an outpatient clinic, there was concern for urosepsis, and he was directed to the ED.  ED Course: Upon arrival to the ED, patient is found to be febrile to 39.4, saturating well on room air, and with normal heart rate and stable BP.  Labs are most notable for creatinine 1.8, AST 87, ALT 103, normal WBC, normal lactic acid, and platelets 127,000.  UA notable only for moderate hemoglobin on the dipstick.  CT of the abdomen and pelvis is negative for acute findings.  Urology was consulted by the ED PA, Foley catheter was placed, and the patient was treated with Rocephin and acetaminophen .  Review of Systems:  All other systems reviewed and apart from HPI, are negative.  Past Medical History:  Diagnosis Date   Adenomatous polyp of colon    AF (atrial fibrillation) (HCC)    after shoulder surgery in 2017 none since   Anemia    pt denies   Arthritis    hands   Benign prostatic hyperplasia    CAD (coronary artery disease)    CAD (coronary artery disease)    Cancer (HCC)    Carotid artery stenosis    CHF (congestive heart failure) (HCC)    Chronic kidney disease    Complication of anesthesia    atrial fib after shoulder surgery 2017   after got to room none since   Dental bridge present    doesnt wear   Duodenal bulb ulcer    Esophageal reflux    Gastropathy    GERD (gastroesophageal reflux disease)    Glaucoma    both  eyes   Heart disease    Hepatitis B    History of neck problems    Hypercholesterolemia    Hypertension    Inguinal pain    Pre-diabetes    Rotator cuff tear    left shoulder    Past Surgical History:  Procedure Laterality Date   angioplasty and stent     cad     CARDIAC CATHETERIZATION  2009   3 heart stents 97 and 99   CATARACT EXTRACTION W/PHACO Left 03/14/2020   Procedure: CATARACT EXTRACTION PHACO AND INTRAOCULAR LENS PLACEMENT (IOC) LEFT ISTENT INJ 5.63 00:37.5;  Surgeon: Rosa College, MD;  Location: [facility];  Service: Ophthalmology;  Laterality: Left;   CATARACT EXTRACTION W/PHACO Right 03/28/2020   Procedure: CATARACT EXTRACTION PHACO AND INTRAOCULAR LENS PLACEMENT (IOC) RIGHT ISTENT INJ;  Surgeon: Rosa College, MD;  Location: [facility];  Service: Ophthalmology;  Laterality: Right;  3.68 0:34.4   cc/stents     COLONOSCOPY N/A 08/10/2022   Procedure: COLONOSCOPY;  Surgeon: Quintin Buckle, DO;  Location: [facility] ENDOSCOPY;  Service: Gastroenterology;  Laterality: N/A;   COLONOSCOPY WITH  PROPOFOL  N/A 11/07/2016   Procedure: COLONOSCOPY WITH PROPOFOL ;  Surgeon: Cassie Click, MD;  Location: [facility] ENDOSCOPY;  Service: Endoscopy;  Laterality: N/A;   COLONOSCOPY, ESOPHAGOGASTRODUODENOSCOPY (EGD) AND ESOPHAGEAL DILATION     CORONARY ANGIOPLASTY  03/2017   CORONARY ARTERY BYPASS GRAFT  08/2015   ESOPHAGOGASTRODUODENOSCOPY (EGD) WITH PROPOFOL  N/A 11/07/2016   Procedure: ESOPHAGOGASTRODUODENOSCOPY (EGD) WITH PROPOFOL ;  Surgeon: Cassie Click, MD;  Location: [facility] ENDOSCOPY;  Service: Endoscopy;  Laterality: N/A;   HYDROCELE EXCISION Bilateral 12/20/2017   Procedure: HYDROCELECTOMY ADULT;  Surgeon: Ottelin, Mark, MD;  Location: [facility];   Service: Urology;  Laterality: Bilateral;   POLYPECTOMY  08/10/2022   Procedure: POLYPECTOMY;  Surgeon: Quintin Buckle, DO;  Location: [facility] ENDOSCOPY;  Service: Gastroenterology;;   stents N/A 2005   x 4    TONSILLECTOMY     VASCULAR SURGERY      Social History:   reports that he has quit smoking. His smoking use included cigarettes. He has a 20 pack-year smoking history. He quit smokeless tobacco use about 30 years ago. He reports that he does not drink alcohol and does not use drugs.  No Known Allergies  Family History  Problem Relation Age of Onset   Heart disease Mother    Heart disease Father    Heart disease Brother      Prior to Admission medications   Medication Sig Start Date End Date Taking? Authorizing Provider  albuterol (VENTOLIN HFA) 108 (90 Base) MCG/ACT inhaler Inhale 2 puffs into the lungs every 6 (six) hours as needed. 04/19/23  Yes [provider]  alfuzosin (UROXATRAL) 10 MG 24 hr tablet Take 10 mg by mouth daily with breakfast.   Yes [provider]  atorvastatin  (LIPITOR) 10 MG tablet Take 1 tablet (10 mg total) by mouth daily at 6 PM. NEEDS FOLLOW UP APPOINTMENT FOR MORE REFILLS Patient taking differently: Take 10 mg by mouth at bedtime. NEEDS FOLLOW UP APPOINTMENT FOR MORE REFILLS 08/06/22  Yes Darlis Eisenmenger, MD  Calcium  Carb-Cholecalciferol 600-10 MG-MCG TABS Take 1 tablet by mouth 2 (two) times daily with a meal.   Yes [provider]  cyanocobalamin (VITAMIN B12) 500 MCG tablet Take 500 mcg by mouth daily.   Yes [provider]  losartan (COZAAR) 25 MG tablet Take 25 mg by mouth daily. 04/26/23  Yes [provider]  metoprolol  succinate (TOPROL -XL) 25 MG 24 hr tablet TAKE 1 TABLET BY MOUTH EVERYDAY AT BEDTIME 06/20/20  Yes Darlis Eisenmenger, MD  pantoprazole  (PROTONIX ) 40 MG tablet Take 1 tablet (40 mg total) by mouth daily. TAKE 1 TABLET BY MOUTH ONCE DAILY 06/11/14  Yes Vivienne Grove, RN  aspirin EC 81 MG  tablet Take 81 mg by mouth daily. Patient not taking: Reported on 06/18/2023    [provider]    Physical Exam: Vitals:   06/17/23 2144 06/18/23 0145 06/18/23 0148 06/18/23 0434  BP: 127/66 (!) 149/85    Pulse: 78 80 80   Resp: 18 18    Temp: (!) 100.8 F (38.2 C)  99 F (37.2 C) (!) 102.7 F (39.3 C)  TempSrc: Oral  Oral Oral  SpO2: 94% 100% 100%   Weight:      Height:        Constitutional: NAD, calm  Eyes: PERTLA, lids and conjunctivae normal ENMT: Mucous membranes are moist. Posterior pharynx clear of any exudate or lesions.   Neck: supple, no masses  Respiratory: no wheezing, no crackles.  No accessory muscle use.  Cardiovascular: S1 & S2 heard, regular rate and rhythm. No extremity edema.   Abdomen: No tenderness, soft. Bowel sounds active.  Musculoskeletal: no clubbing / cyanosis. No joint deformity upper and lower extremities.   Skin: no significant rashes, lesions, ulcers. Warm, dry, well-perfused. Neurologic: CN 2-12 grossly intact. Moving all extremities. Alert and oriented.  Psychiatric: Pleasant. Cooperative.    Labs and Imaging on Admission: I have personally reviewed following labs and imaging studies  CBC: Recent Labs  Lab 06/17/23 2018  WBC 5.7  NEUTROABS 4.5  HGB 13.9  HCT 41.1  MCV 94.9  PLT 127*   Basic Metabolic Panel: Recent Labs  Lab 06/17/23 2018  NA 134*  K 4.1  CL 104  CO2 17*  GLUCOSE 115*  BUN 22  CREATININE 1.81*  CALCIUM  8.6*   GFR: Estimated Creatinine Clearance: 37.5 mL/min (A) (by C-G formula based on SCr of 1.81 mg/dL (H)). Liver Function Tests: Recent Labs  Lab 06/17/23 2018  AST 87*  ALT 103*  ALKPHOS 99  BILITOT 1.0  PROT 6.4*  ALBUMIN 3.5   No results for input(s): "LIPASE", "AMYLASE" in the last 168 hours. No results for input(s): "AMMONIA" in the last 168 hours. Coagulation Profile: No results for input(s): "INR", "PROTIME" in the last 168 hours. Cardiac Enzymes: No results for input(s):  "CKTOTAL", "CKMB", "CKMBINDEX", "TROPONINI" in the last 168 hours. BNP (last 3 results) No results for input(s): "PROBNP" in the last 8760 hours. HbA1C: No results for input(s): "HGBA1C" in the last 72 hours. CBG: No results for input(s): "GLUCAP" in the last 168 hours. Lipid Profile: No results for input(s): "CHOL", "HDL", "LDLCALC", "TRIG", "CHOLHDL", "LDLDIRECT" in the last 72 hours. Thyroid Function Tests: No results for input(s): "TSH", "T4TOTAL", "FREET4", "T3FREE", "THYROIDAB" in the last 72 hours. Anemia Panel: No results for input(s): "VITAMINB12", "FOLATE", "FERRITIN", "TIBC", "IRON", "RETICCTPCT" in the last 72 hours. Urine analysis:    Component Value Date/Time   COLORURINE YELLOW 06/17/2023 2007   APPEARANCEUR CLEAR 06/17/2023 2007   LABSPEC 1.017 06/17/2023 2007   PHURINE 5.0 06/17/2023 2007   GLUCOSEU NEGATIVE 06/17/2023 2007   HGBUR MODERATE (A) 06/17/2023 2007   BILIRUBINUR NEGATIVE 06/17/2023 2007   KETONESUR NEGATIVE 06/17/2023 2007   PROTEINUR NEGATIVE 06/17/2023 2007   NITRITE NEGATIVE 06/17/2023 2007   LEUKOCYTESUR NEGATIVE 06/17/2023 2007   Sepsis Labs: @LABRCNTIP (procalcitonin:4,lacticidven:4) )No results found for this or any previous visit (from the past 240 hours).   Radiological Exams on Admission: CT ABDOMEN PELVIS W CONTRAST Result Date: 06/18/2023 CLINICAL DATA:  Acute nonlocalized abdominal pain EXAM: CT ABDOMEN AND PELVIS WITH CONTRAST TECHNIQUE: Multidetector CT imaging of the abdomen and pelvis was performed using the standard protocol following bolus administration of intravenous contrast. RADIATION DOSE REDUCTION: This exam was performed according to the departmental dose-optimization program which includes automated exposure control, adjustment of the mA and/or kV according to patient size and/or use of iterative reconstruction technique. CONTRAST:  75mL OMNIPAQUE IOHEXOL 350 MG/ML SOLN COMPARISON:  CT abdomen pelvis 01/06/2013 and renal  ultrasound 06/16/2020 FINDINGS: Lower chest: No acute abnormality. Hepatobiliary: Hepatic steatosis. Cholelithiasis without evidence of acute cholecystitis. No biliary dilation. Pancreas: Unremarkable. Spleen: Unremarkable. Adrenals/Urinary Tract: Normal adrenal glands. No urinary calculi or hydronephrosis. Unremarkable bladder. Stomach/Bowel: Normal caliber large and small bowel. Colonic diverticulosis without diverticulitis. Stomach and appendix are within normal limits. Vascular/Lymphatic: Aortic atherosclerosis. Low-density plaque causes moderate to severe narrowing of the proximal SMA (series 2/image 30). No enlarged abdominal or pelvic lymph nodes.  Reproductive: No acute abnormality. Other: No free intraperitoneal fluid or air. Musculoskeletal: No acute fracture. Cranium fresh in fractures and/or large small Schmorl nodes in L3 and L5 are favored chronic. IMPRESSION: 1. No acute abnormality in the abdomen or pelvis. 2. Hepatic steatosis. 3. Cholelithiasis without evidence of acute cholecystitis. 4. Colonic diverticulosis without diverticulitis. 5. Aortic Atherosclerosis (ICD10-I70.0). Electronically Signed   By: Rozell Cornet M.D.   On: 06/18/2023 02:33   DG Chest 2 View Result Date: 06/17/2023 CLINICAL DATA:  Postoperative fevers EXAM: CHEST - 2 VIEW COMPARISON:  11/24/2007 FINDINGS: Cardiac shadow is stable. Postsurgical changes are now seen. Lungs are well aerated bilaterally. No focal infiltrate or effusion is seen. No acute bony abnormality noted. IMPRESSION: No active cardiopulmonary disease. Electronically Signed   By: Violeta Grey M.D.   On: 06/17/2023 20:41    Assessment/Plan   1. Postoperative fever  - Patient underwent UroLift on 06/11/23, has had subjective difficulty emptying his bladder since, and now presents with fever despite Keflex and Bactrim  - Urine does not appear infected and there are no acute findings on CT abdomen/pelvis; no evidence for DVT or pneumonia  - Continue  Rocephin, follow urine and blood cultures, follow-up on urology recommendations    2. CKD 3A  - SCr is 1.81, up from 1.3 in December 2024 (possible AKI vs progression to CKD 3B)  - No hydronephrosis on CT in ED  - Hold losartan for now, renally-dose medications, repeat chem panel in am   3. CAD  - No anginal symptoms  - Continue ASA, Lipitor, metoprolol     DVT prophylaxis: SCDs  Code Status: Full  Level of Care: Level of care: Telemetry Medical Family Communication: None present  Disposition Plan:  Patient is from: home  Anticipated d/c is to: Home  Anticipated d/c date is: Possibly as early as 06/19/23  Patient currently: Pending urology consultation, cultures, clinical course  Consults called: Urology  Admission status: Observation     Walton Guppy, MD [facility]  06/18/2023, 4:46 AM

## 2023-06-18 NOTE — ED Provider Triage Note (Signed)
  Emergency Medicine Provider Triage Evaluation Note  MRN:  [id number]  Arrival date & time: 06/18/23    Medically screening exam initiated at 12:27 AM.   CC:   Post-op Problem   HPI:  Peter Baxter is a 73 y.o. year-old male presents to the ED with chief complaint of fever and urinary hesitancy.  Denies having pain.  States that he just has slow dripping of urine.  History provided by patient. ROS:  -As included in HPI PE:   Vitals:   06/17/23 1939 06/17/23 2144  BP: (!) 159/61 127/66  Pulse: 97 78  Resp: 20 18  Temp: (!) 103 F (39.4 C) (!) 100.8 F (38.2 C)  SpO2: 96% 94%    Non-toxic appearing No respiratory distress  MDM:  Based on signs and symptoms, UTI is highest on my differential. I've ordered abx and fluids in triage to expedite lab/diagnostic workup.  Patient was informed that the remainder of the evaluation will be completed by another provider, this initial triage assessment does not replace that evaluation, and the importance of remaining in the ED until their evaluation is complete.    Sherel Dikes, PA-C 06/18/23 0030

## 2023-06-19 DIAGNOSIS — I2510 Atherosclerotic heart disease of native coronary artery without angina pectoris: Secondary | ICD-10-CM | POA: Diagnosis present

## 2023-06-19 DIAGNOSIS — T8144XA Sepsis following a procedure, initial encounter: Secondary | ICD-10-CM | POA: Diagnosis present

## 2023-06-19 DIAGNOSIS — R7303 Prediabetes: Secondary | ICD-10-CM | POA: Diagnosis present

## 2023-06-19 DIAGNOSIS — N179 Acute kidney failure, unspecified: Secondary | ICD-10-CM | POA: Diagnosis present

## 2023-06-19 DIAGNOSIS — B9620 Unspecified Escherichia coli [E. coli] as the cause of diseases classified elsewhere: Secondary | ICD-10-CM | POA: Diagnosis present

## 2023-06-19 DIAGNOSIS — K219 Gastro-esophageal reflux disease without esophagitis: Secondary | ICD-10-CM | POA: Diagnosis present

## 2023-06-19 DIAGNOSIS — N390 Urinary tract infection, site not specified: Secondary | ICD-10-CM

## 2023-06-19 DIAGNOSIS — A419 Sepsis, unspecified organism: Secondary | ICD-10-CM | POA: Diagnosis not present

## 2023-06-19 DIAGNOSIS — H409 Unspecified glaucoma: Secondary | ICD-10-CM | POA: Diagnosis present

## 2023-06-19 DIAGNOSIS — R350 Frequency of micturition: Secondary | ICD-10-CM | POA: Diagnosis present

## 2023-06-19 DIAGNOSIS — Z7982 Long term (current) use of aspirin: Secondary | ICD-10-CM | POA: Diagnosis not present

## 2023-06-19 DIAGNOSIS — M19041 Primary osteoarthritis, right hand: Secondary | ICD-10-CM | POA: Diagnosis present

## 2023-06-19 DIAGNOSIS — K760 Fatty (change of) liver, not elsewhere classified: Secondary | ICD-10-CM | POA: Diagnosis present

## 2023-06-19 DIAGNOSIS — R3915 Urgency of urination: Secondary | ICD-10-CM | POA: Diagnosis present

## 2023-06-19 DIAGNOSIS — N401 Enlarged prostate with lower urinary tract symptoms: Secondary | ICD-10-CM | POA: Diagnosis present

## 2023-06-19 DIAGNOSIS — I129 Hypertensive chronic kidney disease with stage 1 through stage 4 chronic kidney disease, or unspecified chronic kidney disease: Secondary | ICD-10-CM | POA: Diagnosis present

## 2023-06-19 DIAGNOSIS — R338 Other retention of urine: Secondary | ICD-10-CM | POA: Diagnosis present

## 2023-06-19 DIAGNOSIS — A4151 Sepsis due to Escherichia coli [E. coli]: Secondary | ICD-10-CM | POA: Diagnosis present

## 2023-06-19 DIAGNOSIS — Z8249 Family history of ischemic heart disease and other diseases of the circulatory system: Secondary | ICD-10-CM | POA: Diagnosis not present

## 2023-06-19 DIAGNOSIS — E7800 Pure hypercholesterolemia, unspecified: Secondary | ICD-10-CM | POA: Diagnosis present

## 2023-06-19 DIAGNOSIS — Z955 Presence of coronary angioplasty implant and graft: Secondary | ICD-10-CM | POA: Diagnosis not present

## 2023-06-19 DIAGNOSIS — F17210 Nicotine dependence, cigarettes, uncomplicated: Secondary | ICD-10-CM | POA: Diagnosis present

## 2023-06-19 DIAGNOSIS — R509 Fever, unspecified: Secondary | ICD-10-CM | POA: Diagnosis present

## 2023-06-19 DIAGNOSIS — N1831 Chronic kidney disease, stage 3a: Secondary | ICD-10-CM | POA: Diagnosis present

## 2023-06-19 DIAGNOSIS — M19042 Primary osteoarthritis, left hand: Secondary | ICD-10-CM | POA: Diagnosis present

## 2023-06-19 DIAGNOSIS — Z951 Presence of aortocoronary bypass graft: Secondary | ICD-10-CM | POA: Diagnosis not present

## 2023-06-19 LAB — BASIC METABOLIC PANEL WITH GFR
Anion gap: 8 (ref 5–15)
BUN: 17 mg/dL (ref 8–23)
CO2: 26 mmol/L (ref 22–32)
Calcium: 8.7 mg/dL — ABNORMAL LOW (ref 8.9–10.3)
Chloride: 103 mmol/L (ref 98–111)
Creatinine, Ser: 1.44 mg/dL — ABNORMAL HIGH (ref 0.61–1.24)
GFR, Estimated: 51 mL/min — ABNORMAL LOW (ref >60)
Glucose, Bld: 117 mg/dL — ABNORMAL HIGH (ref 70–99)
Potassium: 4.5 mmol/L (ref 3.5–5.1)
Sodium: 137 mmol/L (ref 135–145)

## 2023-06-19 LAB — CBC
HCT: 41.5 % (ref 39.0–52.0)
Hemoglobin: 14 g/dL (ref 13.0–17.0)
MCH: 31.7 pg (ref 26.0–34.0)
MCHC: 33.7 g/dL (ref 30.0–36.0)
MCV: 94.1 fL (ref 80.0–100.0)
Platelets: 124 K/uL — ABNORMAL LOW (ref 150–400)
RBC: 4.41 MIL/uL (ref 4.22–5.81)
RDW: 14 % (ref 11.5–15.5)
WBC: 6.7 K/uL (ref 4.0–10.5)
nRBC: 0 % (ref 0.0–0.2)

## 2023-06-19 MED ORDER — HYDROCORTISONE 0.5 % EX CREA
TOPICAL_CREAM | Freq: Three times a day (TID) | CUTANEOUS | Status: DC
  Filled 2023-06-19: qty 28.35

## 2023-06-19 MED ORDER — DIPHENHYDRAMINE HCL 25 MG PO CAPS
25.0000 mg | ORAL_CAPSULE | Freq: Four times a day (QID) | ORAL | Status: DC | PRN
  Administered 2023-06-19: 25 mg via ORAL
  Filled 2023-06-19: qty 1

## 2023-06-19 MED ORDER — CHLORHEXIDINE GLUCONATE CLOTH 2 % EX PADS
6.0000 | MEDICATED_PAD | Freq: Every day | CUTANEOUS | Status: DC
  Administered 2023-06-19: 6 via TOPICAL

## 2023-06-19 NOTE — Care Management Obs Status (Signed)
 MEDICARE OBSERVATION STATUS NOTIFICATION   Patient Details  Name: Peter Baxter MRN: [id number] Date of Birth: January 09, 1951   Medicare Observation Status Notification Given:  Yes  IKennith Peak, RN, verbally reviewed observation notice.  06/19/2023, 10:30 AM   Anupama Piehl M, RN 06/19/2023, 10:29 AM

## 2023-06-19 NOTE — Progress Notes (Signed)
 Mobility Specialist Progress Note:   06/19/23 1222  Mobility  Activity Ambulated with assistance in hallway  Level of Assistance Standby assist, set-up cues, supervision of patient - no hands on  Assistive Device Other (Comment) (IV Pole)  Distance Ambulated (ft) 1000 ft  Activity Response Tolerated well  Mobility Referral Yes  Mobility visit 1 Mobility  Mobility Specialist Start Time (ACUTE ONLY) 1149  Mobility Specialist Stop Time (ACUTE ONLY) 1203  Mobility Specialist Time Calculation (min) (ACUTE ONLY) 14 min   Received pt in BR having no complaints and agreeable to mobility. Pt was asymptomatic throughout ambulation and returned to room w/o fault. Left seated EOb w/ call bell in reach and all needs met. Wife in room.   Peter Baxter Mobility Specialist  Please contact vis Secure Chat or  Rehab Office [phone]

## 2023-06-19 NOTE — Plan of Care (Signed)
  Problem: Clinical Measurements: Goal: Signs and symptoms of infection will decrease Outcome: Progressing   Problem: Clinical Measurements: Goal: Ability to maintain clinical measurements within normal limits will improve Outcome: Progressing   Problem: Activity: Goal: Risk for activity intolerance will decrease Outcome: Progressing   Problem: Coping: Goal: Level of anxiety will decrease Outcome: Progressing   Problem: Elimination: Goal: Will not experience complications related to bowel motility Outcome: Progressing

## 2023-06-19 NOTE — TOC CM/SW Note (Signed)
 Transition of Care [facility]) - Inpatient Brief Assessment   Patient Details  Name: Peter Baxter MRN: [id number] Date of Birth: April 28, 1950  [facility]) CM/SW Contact:    Nadie Fiumara M, RN Phone Number: 06/19/2023, 4:28 PM   Clinical Narrative: Patient underwent UroLift on 06/11/23, has had subjective difficulty emptying his bladder since, and now presents with fever despite Keflex and Bactrim.   No dc needs identified; please consult TOC should dc needs arise.    Transition of Care Asessment: Insurance and Status: Insurance coverage has been reviewed Patient has primary care physician: Yes (Dr. Jerone Moorman) Home environment has been reviewed: Lives with spouse Prior level of function:: Independent Prior/Current Home Services: No current home services Social Drivers of Health Review: SDOH reviewed no interventions necessary Readmission risk has been reviewed: Yes Transition of care needs: no transition of care needs at this time  Calla Catchings, RN, BSN  Trauma/Neuro ICU Case Manager [phone]

## 2023-06-19 NOTE — Plan of Care (Signed)

## 2023-06-19 NOTE — Progress Notes (Signed)
  Subjective: Denies pain, no nausea or emesis. Tolerating foley. Afebrile.  Objective: Vital signs in last 24 hours: Temp:  [98.2 F (36.8 C)-100.6 F (38.1 C)] 98.8 F (37.1 C) (05/07 0427) Pulse Rate:  [63-94] 65 (05/07 0427) Resp:  [14-21] 20 (05/07 0427) BP: (117-135)/(60-71) 126/67 (05/07 0427) SpO2:  [94 %-100 %] 95 % (05/07 0427)  Intake/Output from previous day: 05/06 0701 - 05/07 0700 In: 2714.3 [P.O.:700; I.V.:2014.3] Out: 3450 [Urine:3450] Intake/Output this shift: No intake/output data recorded. UOP: 3.4L clear yellow  Physical Exam:  General: Alert and oriented CV: RRR Lungs: Clear Abdomen: Soft, ND, NT Ext: NT, No erythema  Lab Results: Recent Labs    06/17/23 2018 06/18/23 0600 06/19/23 0613  HGB 13.9 13.7 14.0  HCT 41.1 41.0 41.5   BMET Recent Labs    06/18/23 0600 06/19/23 0613  NA 134* 137  K 4.6 4.5  CL 104 103  CO2 22 26  GLUCOSE 125* 117*  BUN 21 17  CREATININE 1.65* 1.44*  CALCIUM  8.2* 8.7*     Studies/Results: CT ABDOMEN PELVIS W CONTRAST Result Date: 06/18/2023 CLINICAL DATA:  Acute nonlocalized abdominal pain EXAM: CT ABDOMEN AND PELVIS WITH CONTRAST TECHNIQUE: Multidetector CT imaging of the abdomen and pelvis was performed using the standard protocol following bolus administration of intravenous contrast. RADIATION DOSE REDUCTION: This exam was performed according to the departmental dose-optimization program which includes automated exposure control, adjustment of the mA and/or kV according to patient size and/or use of iterative reconstruction technique. CONTRAST:  75mL OMNIPAQUE IOHEXOL 350 MG/ML SOLN COMPARISON:  CT abdomen pelvis 01/06/2013 and renal ultrasound 06/16/2020 FINDINGS: Lower chest: No acute abnormality. Hepatobiliary: Hepatic steatosis. Cholelithiasis without evidence of acute cholecystitis. No biliary dilation. Pancreas: Unremarkable. Spleen: Unremarkable. Adrenals/Urinary Tract: Normal adrenal glands. No urinary  calculi or hydronephrosis. Unremarkable bladder. Stomach/Bowel: Normal caliber large and small bowel. Colonic diverticulosis without diverticulitis. Stomach and appendix are within normal limits. Vascular/Lymphatic: Aortic atherosclerosis. Low-density plaque causes moderate to severe narrowing of the proximal SMA (series 2/image 30). No enlarged abdominal or pelvic lymph nodes. Reproductive: No acute abnormality. Other: No free intraperitoneal fluid or air. Musculoskeletal: No acute fracture. Cranium fresh in fractures and/or large small Schmorl nodes in L3 and L5 are favored chronic. IMPRESSION: 1. No acute abnormality in the abdomen or pelvis. 2. Hepatic steatosis. 3. Cholelithiasis without evidence of acute cholecystitis. 4. Colonic diverticulosis without diverticulitis. 5. Aortic Atherosclerosis (ICD10-I70.0). Electronically Signed   By: Rozell Cornet M.D.   On: 06/18/2023 02:33   DG Chest 2 View Result Date: 06/17/2023 CLINICAL DATA:  Postoperative fevers EXAM: CHEST - 2 VIEW COMPARISON:  11/24/2007 FINDINGS: Cardiac shadow is stable. Postsurgical changes are now seen. Lungs are well aerated bilaterally. No focal infiltrate or effusion is seen. No acute bony abnormality noted. IMPRESSION: No active cardiopulmonary disease. Electronically Signed   By: Violeta Grey M.D.   On: 06/17/2023 20:41    Assessment/Plan: Sepsis from urinary source after urolift  -Ucx 10 K e coli. Blood culture no growth -Continue antibiotics. Will require at least 1 week of antibiotics. -Plan for void trial tomorrow prior to discharge   LOS: 0 days   Matt R. Genova Kiner MD 06/19/2023, 10:29 AM [facility]  Pager: [phone]

## 2023-06-19 NOTE — Progress Notes (Signed)
 PROGRESS NOTE    Peter Baxter  ZOX:[id number] DOB: 1950-09-16 DOA: 06/17/2023 PCP: Monique Ano, MD    Brief Narrative:   Peter Baxter is a 73 y.o. male with past medical history significant for HTN, HLD, CAD, CKD stage IIIa, BPH with LUTS s/p UroLift on 06/11/2023 who presented to [facility] ED on 06/17/2023 with fever, difficulty voiding since his procedure 1 week ago.  Has been taking Keflex and Bactrim without missed doses.  Patient reports dribbling small volumes of urine every several minutes with a sensation that he is not completely emptying his bladder.  Reports experiencing dysuria immediately after the procedure.  He was seen at outpatient clinic, concern for urosepsis and was directed to the ED for further evaluation and management.  In the ED, temperature 103.0 F, HR 97, RR 20, BP 159/61, SpO2 96% room air.  WBC 5.7, hemoglobin 13.9, plate count 811.  Sodium 134, potassium 4.1, chloride 104, CO2 17, glucose 115, BUN 22, Cram 1.81.  AST 87, ALT 103, total bilirubin 1.0.  Lactic acid 1.2.  Urinalysis with negative leukocytes, negative nitrite, no bacteria, 0-5 WBCs.  Chest x-ray with no active cardiopulmonary disease process.  CT abdomen/pelvis with no acute abnormality in the abdomen/pelvis, noted hepatic steatosis, cholelithiasis without evidence of acute cholecystitis, colonic diverticulosis without diverticulitis, aortic atherosclerosis.  Urology consulted by ED PA.  Foley catheter placed.  Patient was treated with IV ceftriaxone and acetaminophen .  TRH consulted for admission.  Assessment & Plan:   Sepsis, POA E. coli urinary tract infection Patient presenting with fever, difficulty urinating following recent UroLift procedure 1 week prior by urology.  Fever 103.0 F on admission.  Lactic acid 0.6. -- Urology following, appreciate assistance -- Fever curve improving, Tmax past 24 hours 100.6 -- Urine culture: + 10K Ecoli; susceptibilities -- Blood cultures with no growth less than  12 hours -- Ceftriaxone 2 g IV every 24 hours  Acute urinary retention Patient reports difficulty urinating following UroLift procedure outpatient.  Foley catheter placed on admission --Urology following, appreciate assistance -- Alfuzosin 10mg  POP qAM -- Potential voiding trial prior to discharge tomorrow versus outpatient follow-up with urology  Transaminitis Suspect etiology likely secondary from underlying sepsis/UTI as above.  Also with hepatic steatosis noted on imaging.  Bilirubin within normal limits.  Also complicated by use of statin outpatient. -- Hold home statin -- CMP in am  CKD stage IIIa -- Cr 1.81>1.65>1.44 (baseline 1.26 04/2020) -- IV fluids now discontinued -- Holding home losartan -- Avoid nephrotoxins, renal dose all medications -- Repeat CMP in a.m.  HTN -- Metoprolol  succinate 25 mg p.o. daily -- Holding home losartan for now -- Continue aspirin -- Holding statin as above  HLD -- Holding home atorvastatin  due to transaminitis as above  GERD -- Protonix  40 mg p.o. daily   DVT prophylaxis: SCDs Start: 06/18/23 0435    Code Status: Full Code Family Communication: Updated spouse present at bedside this morning  Disposition Plan:  Level of care: Telemetry Medical Status is: Inpatient Remains inpatient appropriate because: IV antibiotics, awaiting results of urine culture, potential discharge home after voiding trial tomorrow per urology    Consultants:  Urology  Procedures:  Foley catheter placement 5/5  Antimicrobials:  Ceftriaxone 5/5>>   Subjective: Patient seen examined bedside, lying in bed.  Spouse present at bedside.  Reports feeling much improved, fever curve also improving currently afebrile but Tmax 100.6 over the past 24 hours.  Seen by urology this morning, Dr. Freddi Jaeger.  Potential  plan of voiding trial tomorrow and then potential discharge.  Urine culture growing E. coli, awaiting susceptibilities.  Remains on IV ceftriaxone.  No other  questions or concerns at this time.  Denies headache, no dizziness, no chest pain, no palpitations, no shortness of breath, no abdominal pain, no current fever, no chills/night sweats, no nausea cefonicid diarrhea, no focal weakness, no fatigue, no paresthesia.  No acute events overnight per nursing staff.  Objective: Vitals:   06/18/23 2249 06/19/23 0236 06/19/23 0427 06/19/23 1030  BP: 117/60 118/64 126/67 (!) 112/57  Pulse: 64 64 65 74  Resp: 17 17 20 18   Temp: 98.2 F (36.8 C) 98.3 F (36.8 C) 98.8 F (37.1 C) 98.5 F (36.9 C)  TempSrc: Oral Oral    SpO2: 98% 98% 95% 95%  Weight:      Height:        Intake/Output Summary (Last 24 hours) at 06/19/2023 1238 Last data filed at 06/19/2023 0500 Gross per 24 hour  Intake 2714.31 ml  Output 3450 ml  Net -735.69 ml   Filed Weights   06/17/23 1954  Weight: 81.6 kg    Examination:  Physical Exam: GEN: NAD, alert and oriented x 3, wd/wn HEENT: NCAT, PERRL, EOMI, sclera clear, MMM PULM: CTAB w/o wheezes/crackles, normal respiratory effort, on room air CV: RRR w/o M/G/R GI: abd soft, NTND, NABS, no R/G/M GU: Foley catheter noted in place draining clear yellow urine in collection bag MSK: no peripheral edema, muscle strength globally intact 5/5 bilateral upper/lower extremities NEURO: CN II-XII intact, no focal deficits, sensation to light touch intact PSYCH: normal mood/affect Integumentary: dry/intact, no rashes or wounds    Data Reviewed: I have personally reviewed following labs and imaging studies  CBC: Recent Labs  Lab 06/17/23 2018 06/18/23 0600 06/19/23 0613  WBC 5.7 6.7 6.7  NEUTROABS 4.5  --   --   HGB 13.9 13.7 14.0  HCT 41.1 41.0 41.5  MCV 94.9 95.6 94.1  PLT 127* 119* 124*   Basic Metabolic Panel: Recent Labs  Lab 06/17/23 2018 06/18/23 0600 06/19/23 0613  NA 134* 134* 137  K 4.1 4.6 4.5  CL 104 104 103  CO2 17* 22 26  GLUCOSE 115* 125* 117*  BUN 22 21 17   CREATININE 1.81* 1.65* 1.44*  CALCIUM   8.6* 8.2* 8.7*   GFR: Estimated Creatinine Clearance: 47.2 mL/min (A) (by C-G formula based on SCr of 1.44 mg/dL (H)). Liver Function Tests: Recent Labs  Lab 06/17/23 2018 06/18/23 0600  AST 87* 92*  ALT 103* 112*  ALKPHOS 99 97  BILITOT 1.0 0.9  PROT 6.4* 6.1*  ALBUMIN 3.5 3.3*   No results for input(s): "LIPASE", "AMYLASE" in the last 168 hours. No results for input(s): "AMMONIA" in the last 168 hours. Coagulation Profile: No results for input(s): "INR", "PROTIME" in the last 168 hours. Cardiac Enzymes: No results for input(s): "CKTOTAL", "CKMB", "CKMBINDEX", "TROPONINI" in the last 168 hours. BNP (last 3 results) No results for input(s): "PROBNP" in the last 8760 hours. HbA1C: No results for input(s): "HGBA1C" in the last 72 hours. CBG: No results for input(s): "GLUCAP" in the last 168 hours. Lipid Profile: No results for input(s): "CHOL", "HDL", "LDLCALC", "TRIG", "CHOLHDL", "LDLDIRECT" in the last 72 hours. Thyroid Function Tests: No results for input(s): "TSH", "T4TOTAL", "FREET4", "T3FREE", "THYROIDAB" in the last 72 hours. Anemia Panel: No results for input(s): "VITAMINB12", "FOLATE", "FERRITIN", "TIBC", "IRON", "RETICCTPCT" in the last 72 hours. Sepsis Labs: Recent Labs  Lab 06/17/23 2032 06/18/23 0049  LATICACIDVEN 1.2 0.6    Recent Results (from the past 240 hours)  Urine Culture     Status: Abnormal (Preliminary result)   Collection Time: 06/18/23 12:44 AM   Specimen: Urine, Clean Catch  Result Value Ref Range Status   Specimen Description URINE, CLEAN CATCH  Final   Special Requests NONE  Final   Culture (A)  Final    10,000 COLONIES/mL ESCHERICHIA COLI SUSCEPTIBILITIES TO FOLLOW Performed at [facility], [address], [place], [place] [address]    Report Status PENDING  Incomplete  Culture, blood (Routine X 2) w Reflex to ID Panel     Status: None (Preliminary result)   Collection Time: 06/18/23  3:46 AM   Specimen: BLOOD  Result Value  Ref Range Status   Specimen Description BLOOD BLOOD RIGHT HAND  Final   Special Requests   Final    BOTTLES DRAWN AEROBIC AND ANAEROBIC Blood Culture adequate volume   Culture   Final    NO GROWTH 1 DAY Performed at [facility], [address] [place], [place] [address]    Report Status PENDING  Incomplete  Culture, blood (Routine X 2) w Reflex to ID Panel     Status: None (Preliminary result)   Collection Time: 06/18/23  3:52 AM   Specimen: BLOOD  Result Value Ref Range Status   Specimen Description BLOOD BLOOD LEFT HAND  Final   Special Requests   Final    BOTTLES DRAWN AEROBIC AND ANAEROBIC Blood Culture adequate volume   Culture   Final    NO GROWTH 1 DAY Performed at [facility], [address] [place], [place] [address]    Report Status PENDING  Incomplete         Radiology Studies: CT ABDOMEN PELVIS W CONTRAST Result Date: 06/18/2023 CLINICAL DATA:  Acute nonlocalized abdominal pain EXAM: CT ABDOMEN AND PELVIS WITH CONTRAST TECHNIQUE: Multidetector CT imaging of the abdomen and pelvis was performed using the standard protocol following bolus administration of intravenous contrast. RADIATION DOSE REDUCTION: This exam was performed according to the departmental dose-optimization program which includes automated exposure control, adjustment of the mA and/or kV according to patient size and/or use of iterative reconstruction technique. CONTRAST:  75mL OMNIPAQUE IOHEXOL 350 MG/ML SOLN COMPARISON:  CT abdomen pelvis 01/06/2013 and renal ultrasound 06/16/2020 FINDINGS: Lower chest: No acute abnormality. Hepatobiliary: Hepatic steatosis. Cholelithiasis without evidence of acute cholecystitis. No biliary dilation. Pancreas: Unremarkable. Spleen: Unremarkable. Adrenals/Urinary Tract: Normal adrenal glands. No urinary calculi or hydronephrosis. Unremarkable bladder. Stomach/Bowel: Normal caliber large and small bowel. Colonic diverticulosis without diverticulitis. Stomach and  appendix are within normal limits. Vascular/Lymphatic: Aortic atherosclerosis. Low-density plaque causes moderate to severe narrowing of the proximal SMA (series 2/image 30). No enlarged abdominal or pelvic lymph nodes. Reproductive: No acute abnormality. Other: No free intraperitoneal fluid or air. Musculoskeletal: No acute fracture. Cranium fresh in fractures and/or large small Schmorl nodes in L3 and L5 are favored chronic. IMPRESSION: 1. No acute abnormality in the abdomen or pelvis. 2. Hepatic steatosis. 3. Cholelithiasis without evidence of acute cholecystitis. 4. Colonic diverticulosis without diverticulitis. 5. Aortic Atherosclerosis (ICD10-I70.0). Electronically Signed   By: Rozell Cornet M.D.   On: 06/18/2023 02:33   DG Chest 2 View Result Date: 06/17/2023 CLINICAL DATA:  Postoperative fevers EXAM: CHEST - 2 VIEW COMPARISON:  11/24/2007 FINDINGS: Cardiac shadow is stable. Postsurgical changes are now seen. Lungs are well aerated bilaterally. No focal infiltrate or effusion is seen. No acute bony  abnormality noted. IMPRESSION: No active cardiopulmonary disease. Electronically Signed   By: Violeta Grey M.D.   On: 06/17/2023 20:41        Scheduled Meds:  alfuzosin  10 mg Oral Q breakfast   aspirin EC  81 mg Oral Daily   atorvastatin   10 mg Oral q1800   Chlorhexidine Gluconate Cloth  6 each Topical Daily   metoprolol  succinate  25 mg Oral QHS   pantoprazole   40 mg Oral Daily   sodium chloride  flush  3 mL Intravenous Q12H   Continuous Infusions:  cefTRIAXone (ROCEPHIN)  IV 2 g (06/19/23 1042)     LOS: 0 days    Time spent: 51 minutes spent on 06/19/2023 caring for this patient face-to-face including chart review, ordering labs/tests, documenting, discussion with nursing staff, consultants, updating family and interview/physical exam    Rema Care [place], DO [facility] Available via Epic secure chat 7am-7pm After these hours, please refer to coverage provider listed on  [email or web address] 06/19/2023, 12:38 PM

## 2023-06-20 DIAGNOSIS — R338 Other retention of urine: Secondary | ICD-10-CM | POA: Diagnosis not present

## 2023-06-20 DIAGNOSIS — B9620 Unspecified Escherichia coli [E. coli] as the cause of diseases classified elsewhere: Secondary | ICD-10-CM | POA: Diagnosis not present

## 2023-06-20 DIAGNOSIS — N390 Urinary tract infection, site not specified: Secondary | ICD-10-CM | POA: Diagnosis not present

## 2023-06-20 LAB — COMPREHENSIVE METABOLIC PANEL WITH GFR
ALT: 79 U/L — ABNORMAL HIGH (ref 0–44)
AST: 40 U/L (ref 15–41)
Albumin: 2.8 g/dL — ABNORMAL LOW (ref 3.5–5.0)
Alkaline Phosphatase: 85 U/L (ref 38–126)
Anion gap: 7 (ref 5–15)
BUN: 19 mg/dL (ref 8–23)
CO2: 27 mmol/L (ref 22–32)
Calcium: 8.3 mg/dL — ABNORMAL LOW (ref 8.9–10.3)
Chloride: 103 mmol/L (ref 98–111)
Creatinine, Ser: 1.43 mg/dL — ABNORMAL HIGH (ref 0.61–1.24)
GFR, Estimated: 52 mL/min — ABNORMAL LOW (ref >60)
Glucose, Bld: 117 mg/dL — ABNORMAL HIGH (ref 70–99)
Potassium: 4.2 mmol/L (ref 3.5–5.1)
Sodium: 137 mmol/L (ref 135–145)
Total Bilirubin: 0.8 mg/dL (ref 0.0–1.2)
Total Protein: 5.6 g/dL — ABNORMAL LOW (ref 6.5–8.1)

## 2023-06-20 LAB — URINE CULTURE: Culture: 10000 — AB

## 2023-06-20 MED ORDER — ALFUZOSIN HCL ER 10 MG PO TB24: 10.0000 mg | ORAL_TABLET | Freq: Every day | ORAL | 0 refills | Status: AC

## 2023-06-20 MED ORDER — CEPHALEXIN 500 MG PO CAPS: 500.0000 mg | ORAL_CAPSULE | Freq: Two times a day (BID) | ORAL | 0 refills | Status: AC

## 2023-06-20 NOTE — Progress Notes (Signed)
 Reviewed AVS, patient expressed understanding of medications, MD follow up reviewed.   Removed IV, Site clean, dry and intact.  Patient states all belongings brought to the hospital at time of admission are accounted for and packed to take home. Patient informed and expressed understanding of where to pick up medications as instructed on AVS.   Pt transported to entrance A where family member was waiting in vehicle to transport home.

## 2023-06-20 NOTE — Progress Notes (Signed)
 Mobility Specialist Progress Note:    06/20/23 1157  Mobility  Activity Ambulated with assistance in hallway  Level of Assistance Standby assist, set-up cues, supervision of patient - no hands on  Assistive Device None  Distance Ambulated (ft) 1000 ft  Activity Response Tolerated well  Mobility Referral Yes  Mobility visit 1 Mobility  Mobility Specialist Start Time (ACUTE ONLY) [id number]  Mobility Specialist Stop Time (ACUTE ONLY) [id number]  Mobility Specialist Time Calculation (min) (ACUTE ONLY) 14 min   Received pt in chair having no complaints and agreeable to mobility. Pt was asymptomatic throughout ambulation and returned to room w/o fault. Left in chair w/ call bell in reach and all needs met.   Inetta Manes Mobility Specialist  Please contact vis Secure Chat or  Rehab Office [phone]

## 2023-06-20 NOTE — Discharge Summary (Signed)
 Physician Discharge Summary  Peter Baxter ZOX:[id number] DOB: 1950-10-17 DOA: 06/17/2023  PCP: Monique Ano, MD  Admit date: 06/17/2023 Discharge date: 06/20/2023  Admitted From: Home Disposition: Home  Recommendations for Outpatient Follow-up:  Follow up with PCP in 1-2 weeks Holding losartan on discharge due to borderline blood pressures as well as AKI Follow-up with urology, Dr. Freddi Jaeger Continue Keflex on discharge for treatment of E. coli UTI Recommend repeat CMP 1 week  Home Health: No Equipment/Devices: None  Discharge Condition: Stable CODE STATUS: Full code Diet recommendation: Heart healthy diet  History of present illness:  Peter Baxter is a 73 y.o. male with past medical history significant for HTN, HLD, CAD, CKD stage IIIa, BPH with LUTS s/p UroLift on 06/11/2023 who presented to [facility] ED on 06/17/2023 with fever, difficulty voiding since his procedure 1 week ago.  Has been taking Keflex and Bactrim without missed doses.  Patient reports dribbling small volumes of urine every several minutes with a sensation that he is not completely emptying his bladder.  Reports experiencing dysuria immediately after the procedure.  He was seen at outpatient clinic, concern for urosepsis and was directed to the ED for further evaluation and management.   In the ED, temperature 103.0 F, HR 97, RR 20, BP 159/61, SpO2 96% room air.  WBC 5.7, hemoglobin 13.9, plate count 811.  Sodium 134, potassium 4.1, chloride 104, CO2 17, glucose 115, BUN 22, Cram 1.81.  AST 87, ALT 103, total bilirubin 1.0.  Lactic acid 1.2.  Urinalysis with negative leukocytes, negative nitrite, no bacteria, 0-5 WBCs.  Chest x-ray with no active cardiopulmonary disease process.  CT abdomen/pelvis with no acute abnormality in the abdomen/pelvis, noted hepatic steatosis, cholelithiasis without evidence of acute cholecystitis, colonic diverticulosis without diverticulitis, aortic atherosclerosis.  Urology consulted by ED PA.   Foley catheter placed.  Patient was treated with IV ceftriaxone and acetaminophen .  TRH consulted for admission.  Hospital course:  Sepsis, POA E. coli urinary tract infection Patient presenting with fever, difficulty urinating following recent UroLift procedure 1 week prior by urology.  Fever 103.0 F on admission.  Lactic acid 0.6.  Likely complicated by acute urine retention requiring Foley catheter placement on admission.  Urine culture positive for E. coli.  Initially started on ceftriaxone and will transition to Keflex in accordance with culture susceptibilities to complete antibiotic course on discharge.  Outpatient follow-up with urology/PCP.   Acute urinary retention Patient reports difficulty urinating following UroLift procedure outpatient.  Urology was consulted and followed during hospital course.  Foley catheter placed on admission. Continue alfuzosin 10mg  PO qAM. patient underwent voiding trial that was successful on 06/20/2023 with no significant residual urine retention after self voiding on bladder scan.  Outpatient follow-up with urology, Dr. Freddi Jaeger.    Transaminitis Suspect etiology likely secondary from underlying sepsis/UTI as above.  Also with hepatic steatosis noted on imaging.  Bilirubin within normal limits.  Also complicated by use of statin outpatient.  Home statin was held during hospitalization with improvement of transaminitis.  May resume statin on discharge.  Recommend repeat CMP in 1 week.  CKD stage IIIa Creatinine elevated 1.81 on admission likely secondary to sepsis/UTI as well as acute urinary retention as above.  Initially had Foley catheter placed, IV antibiotics.  Creatinine improved to 1.43 at time of discharge.  Recommend repeat CMP 1 week.   HTN Metoprolol  succinate 25 mg p.o. daily, discontinued home losartan for now.  Outpatient follow-up with PCP with repeat CMP 1 week.  HLD Resume home statin on discharge.  Repeat CMP as above.   GERD Protonix  40  mg p.o. daily  Discharge Diagnoses:  Principal Problem:   Fever Active Problems:   Coronary artery disease involving native coronary artery of native heart   Essential hypertension   CKD stage 3a, GFR 45-59 ml/min [facility])    Discharge Instructions  Discharge Instructions     Call MD for:  difficulty breathing, headache or visual disturbances   Complete by: As directed    Call MD for:  extreme fatigue   Complete by: As directed    Call MD for:  persistant dizziness or light-headedness   Complete by: As directed    Call MD for:  persistant nausea and vomiting   Complete by: As directed    Call MD for:  severe uncontrolled pain   Complete by: As directed    Call MD for:  temperature >100.4   Complete by: As directed    Diet - low sodium heart healthy   Complete by: As directed    Increase activity slowly   Complete by: As directed       Allergies as of 06/20/2023   No Known Allergies      Medication List     PAUSE taking these medications    losartan 25 MG tablet Wait to take this until your doctor or other care provider tells you to start again. Commonly known as: COZAAR Take 25 mg by mouth daily.       TAKE these medications    albuterol 108 (90 Base) MCG/ACT inhaler Commonly known as: VENTOLIN HFA Inhale 2 puffs into the lungs every 6 (six) hours as needed.   alfuzosin 10 MG 24 hr tablet Commonly known as: UROXATRAL Take 1 tablet (10 mg total) by mouth daily with breakfast.   aspirin EC 81 MG tablet Take 81 mg by mouth daily.   atorvastatin  10 MG tablet Commonly known as: LIPITOR Take 1 tablet (10 mg total) by mouth daily at 6 PM. NEEDS FOLLOW UP APPOINTMENT FOR MORE REFILLS What changed: when to take this   Calcium  Carb-Cholecalciferol 600-10 MG-MCG Tabs Take 1 tablet by mouth 2 (two) times daily with a meal.   cephALEXin 500 MG capsule Commonly known as: KEFLEX Take 1 capsule (500 mg total) by mouth 2 (two) times daily for 8 days.    cyanocobalamin 500 MCG tablet Commonly known as: VITAMIN B12 Take 500 mcg by mouth daily.   metoprolol  succinate 25 MG 24 hr tablet Commonly known as: TOPROL -XL TAKE 1 TABLET BY MOUTH EVERYDAY AT BEDTIME   pantoprazole  40 MG tablet Commonly known as: PROTONIX  Take 1 tablet (40 mg total) by mouth daily. TAKE 1 TABLET BY MOUTH ONCE DAILY        Follow-up Information     Monique Ano, MD. Schedule an appointment as soon as possible for a visit in 1 week(s).   Specialty: Family Medicine Contact information: [address] [facility] [place] [address] [phone]         Lahoma Pigg, MD. Schedule an appointment as soon as possible for a visit.   Specialty: Urology Contact information: [address] [place] [address] [phone]                No Known Allergies  Consultations: Urology, Dr. Freddi Jaeger   Procedures/Studies: CT ABDOMEN PELVIS W CONTRAST Result Date: 06/18/2023 CLINICAL DATA:  Acute nonlocalized abdominal pain EXAM: CT ABDOMEN AND PELVIS WITH CONTRAST  TECHNIQUE: Multidetector CT imaging of the abdomen and pelvis was performed using the standard protocol following bolus administration of intravenous contrast. RADIATION DOSE REDUCTION: This exam was performed according to the departmental dose-optimization program which includes automated exposure control, adjustment of the mA and/or kV according to patient size and/or use of iterative reconstruction technique. CONTRAST:  75mL OMNIPAQUE IOHEXOL 350 MG/ML SOLN COMPARISON:  CT abdomen pelvis 01/06/2013 and renal ultrasound 06/16/2020 FINDINGS: Lower chest: No acute abnormality. Hepatobiliary: Hepatic steatosis. Cholelithiasis without evidence of acute cholecystitis. No biliary dilation. Pancreas: Unremarkable. Spleen: Unremarkable. Adrenals/Urinary Tract: Normal adrenal glands. No urinary calculi or hydronephrosis. Unremarkable bladder. Stomach/Bowel: Normal caliber large and small  bowel. Colonic diverticulosis without diverticulitis. Stomach and appendix are within normal limits. Vascular/Lymphatic: Aortic atherosclerosis. Low-density plaque causes moderate to severe narrowing of the proximal SMA (series 2/image 30). No enlarged abdominal or pelvic lymph nodes. Reproductive: No acute abnormality. Other: No free intraperitoneal fluid or air. Musculoskeletal: No acute fracture. Cranium fresh in fractures and/or large small Schmorl nodes in L3 and L5 are favored chronic. IMPRESSION: 1. No acute abnormality in the abdomen or pelvis. 2. Hepatic steatosis. 3. Cholelithiasis without evidence of acute cholecystitis. 4. Colonic diverticulosis without diverticulitis. 5. Aortic Atherosclerosis (ICD10-I70.0). Electronically Signed   By: Rozell Cornet M.D.   On: 06/18/2023 02:33   DG Chest 2 View Result Date: 06/17/2023 CLINICAL DATA:  Postoperative fevers EXAM: CHEST - 2 VIEW COMPARISON:  11/24/2007 FINDINGS: Cardiac shadow is stable. Postsurgical changes are now seen. Lungs are well aerated bilaterally. No focal infiltrate or effusion is seen. No acute bony abnormality noted. IMPRESSION: No active cardiopulmonary disease. Electronically Signed   By: Violeta Grey M.D.   On: 06/17/2023 20:41     Subjective: Patient seen examined bedside, sitting on edge of bed.  Eating breakfast.  Foley catheter removed this morning; and able to void on his own without any significant residual urine retention on bladder scan.  Ready for discharge home.  No other specific complaints, concerns or questions at this time.  Denies headache, no dizziness, no chest pain, no palpitations, no shortness of breath, no abdominal pain, no fever/chills/night sweats, no nausea/vomiting/diarrhea, no focal weakness, no fatigue, no paresthesias.  No acute events overnight per nursing.  Discharge Exam: Vitals:   06/20/23 0444 06/20/23 0729  BP: 115/70 103/70  Pulse: 70 75  Resp: 18 18  Temp: 98.8 F (37.1 C) 99.5 F (37.5  C)  SpO2: 95% 93%   Vitals:   06/19/23 2008 06/19/23 2054 06/20/23 0444 06/20/23 0729  BP: 111/70 111/70 115/70 103/70  Pulse: 73 73 70 75  Resp: 16  18 18   Temp: 98.3 F (36.8 C)  98.8 F (37.1 C) 99.5 F (37.5 C)  TempSrc: Oral  Oral Oral  SpO2: 97%  95% 93%  Weight:      Height:        Physical Exam: GEN: NAD, alert and oriented x 3, wd/wn HEENT: NCAT, PERRL, EOMI, sclera clear, MMM PULM: CTAB w/o wheezes/crackles, normal respiratory effort, on room air CV: RRR w/o M/G/R GI: abd soft, NTND, NABS, no R/G/M MSK: no peripheral edema, muscle strength globally intact 5/5 bilateral upper/lower extremities NEURO: CN II-XII intact, no focal deficits, sensation to light touch intact PSYCH: normal mood/affect Integumentary: dry/intact, no rashes or wounds    The results of significant diagnostics from this hospitalization (including imaging, microbiology, ancillary and laboratory) are listed below for reference.     Microbiology: Recent Results (from the past 240 hours)  Urine  Culture     Status: Abnormal   Collection Time: 06/18/23 12:44 AM   Specimen: Urine, Clean Catch  Result Value Ref Range Status   Specimen Description URINE, CLEAN CATCH  Final   Special Requests   Final    NONE Performed at [facility], [address] [place], [place] [address]    Culture 10,000 COLONIES/mL ESCHERICHIA COLI (A)  Final   Report Status 06/20/2023 FINAL  Final   Organism ID, Bacteria ESCHERICHIA COLI (A)  Final      Susceptibility   Escherichia coli - MIC*    AMPICILLIN >=32 RESISTANT Resistant     CEFAZOLIN  <=4 SENSITIVE Sensitive     CEFEPIME <=0.12 SENSITIVE Sensitive     CEFTRIAXONE <=0.25 SENSITIVE Sensitive     CIPROFLOXACIN <=0.25 SENSITIVE Sensitive     GENTAMICIN <=1 SENSITIVE Sensitive     IMIPENEM <=0.25 SENSITIVE Sensitive     NITROFURANTOIN <=16 SENSITIVE Sensitive     TRIMETH/SULFA >=320 RESISTANT Resistant     AMPICILLIN/SULBACTAM >=32 RESISTANT  Resistant     PIP/TAZO <=4 SENSITIVE Sensitive ug/mL    * 10,000 COLONIES/mL ESCHERICHIA COLI  Culture, blood (Routine X 2) w Reflex to ID Panel     Status: None (Preliminary result)   Collection Time: 06/18/23  3:46 AM   Specimen: BLOOD  Result Value Ref Range Status   Specimen Description BLOOD BLOOD RIGHT HAND  Final   Special Requests   Final    BOTTLES DRAWN AEROBIC AND ANAEROBIC Blood Culture adequate volume   Culture   Final    NO GROWTH 2 DAYS Performed at [facility], [address], [place], [place] [address]    Report Status PENDING  Incomplete  Culture, blood (Routine X 2) w Reflex to ID Panel     Status: None (Preliminary result)   Collection Time: 06/18/23  3:52 AM   Specimen: BLOOD  Result Value Ref Range Status   Specimen Description BLOOD BLOOD LEFT HAND  Final   Special Requests   Final    BOTTLES DRAWN AEROBIC AND ANAEROBIC Blood Culture adequate volume   Culture   Final    NO GROWTH 2 DAYS Performed at [facility], [address], [place], [place] [address]    Report Status PENDING  Incomplete     Labs: BNP (last 3 results) No results for input(s): "BNP" in the last 8760 hours. Basic Metabolic Panel: Recent Labs  Lab 06/17/23 2018 06/18/23 0600 06/19/23 0613 06/20/23 0707  NA 134* 134* 137 137  K 4.1 4.6 4.5 4.2  CL 104 104 103 103  CO2 17* 22 26 27   GLUCOSE 115* 125* 117* 117*  BUN 22 21 17 19   CREATININE 1.81* 1.65* 1.44* 1.43*  CALCIUM  8.6* 8.2* 8.7* 8.3*   Liver Function Tests: Recent Labs  Lab 06/17/23 2018 06/18/23 0600 06/20/23 0707  AST 87* 92* 40  ALT 103* 112* 79*  ALKPHOS 99 97 85  BILITOT 1.0 0.9 0.8  PROT 6.4* 6.1* 5.6*  ALBUMIN 3.5 3.3* 2.8*   No results for input(s): "LIPASE", "AMYLASE" in the last 168 hours. No results for input(s): "AMMONIA" in the last 168 hours. CBC: Recent Labs  Lab 06/17/23 2018 06/18/23 0600 06/19/23 0613  WBC 5.7 6.7 6.7  NEUTROABS 4.5  --   --   HGB 13.9 13.7 14.0  HCT  41.1 41.0 41.5  MCV 94.9 95.6 94.1  PLT 127* 119* 124*   Cardiac Enzymes: No results for  input(s): "CKTOTAL", "CKMB", "CKMBINDEX", "TROPONINI" in the last 168 hours. BNP: Invalid input(s): "POCBNP" CBG: No results for input(s): "GLUCAP" in the last 168 hours. D-Dimer No results for input(s): "DDIMER" in the last 72 hours. Hgb A1c No results for input(s): "HGBA1C" in the last 72 hours. Lipid Profile No results for input(s): "CHOL", "HDL", "LDLCALC", "TRIG", "CHOLHDL", "LDLDIRECT" in the last 72 hours. Thyroid function studies No results for input(s): "TSH", "T4TOTAL", "T3FREE", "THYROIDAB" in the last 72 hours.  Invalid input(s): "FREET3" Anemia work up No results for input(s): "VITAMINB12", "FOLATE", "FERRITIN", "TIBC", "IRON", "RETICCTPCT" in the last 72 hours. Urinalysis    Component Value Date/Time   COLORURINE YELLOW 06/17/2023 2007   APPEARANCEUR CLEAR 06/17/2023 2007   LABSPEC 1.017 06/17/2023 2007   PHURINE 5.0 06/17/2023 2007   GLUCOSEU NEGATIVE 06/17/2023 2007   HGBUR MODERATE (A) 06/17/2023 2007   BILIRUBINUR NEGATIVE 06/17/2023 2007   KETONESUR NEGATIVE 06/17/2023 2007   PROTEINUR NEGATIVE 06/17/2023 2007   NITRITE NEGATIVE 06/17/2023 2007   LEUKOCYTESUR NEGATIVE 06/17/2023 2007   Sepsis Labs Recent Labs  Lab 06/17/23 2018 06/18/23 0600 06/19/23 0613  WBC 5.7 6.7 6.7   Microbiology Recent Results (from the past 240 hours)  Urine Culture     Status: Abnormal   Collection Time: 06/18/23 12:44 AM   Specimen: Urine, Clean Catch  Result Value Ref Range Status   Specimen Description URINE, CLEAN CATCH  Final   Special Requests   Final    NONE Performed at [facility], [address], [place], [place] [address]    Culture 10,000 COLONIES/mL ESCHERICHIA COLI (A)  Final   Report Status 06/20/2023 FINAL  Final   Organism ID, Bacteria ESCHERICHIA COLI (A)  Final      Susceptibility   Escherichia coli - MIC*    AMPICILLIN >=32 RESISTANT Resistant      CEFAZOLIN  <=4 SENSITIVE Sensitive     CEFEPIME <=0.12 SENSITIVE Sensitive     CEFTRIAXONE <=0.25 SENSITIVE Sensitive     CIPROFLOXACIN <=0.25 SENSITIVE Sensitive     GENTAMICIN <=1 SENSITIVE Sensitive     IMIPENEM <=0.25 SENSITIVE Sensitive     NITROFURANTOIN <=16 SENSITIVE Sensitive     TRIMETH/SULFA >=320 RESISTANT Resistant     AMPICILLIN/SULBACTAM >=32 RESISTANT Resistant     PIP/TAZO <=4 SENSITIVE Sensitive ug/mL    * 10,000 COLONIES/mL ESCHERICHIA COLI  Culture, blood (Routine X 2) w Reflex to ID Panel     Status: None (Preliminary result)   Collection Time: 06/18/23  3:46 AM   Specimen: BLOOD  Result Value Ref Range Status   Specimen Description BLOOD BLOOD RIGHT HAND  Final   Special Requests   Final    BOTTLES DRAWN AEROBIC AND ANAEROBIC Blood Culture adequate volume   Culture   Final    NO GROWTH 2 DAYS Performed at [facility], [address], [place], [place] [address]    Report Status PENDING  Incomplete  Culture, blood (Routine X 2) w Reflex to ID Panel     Status: None (Preliminary result)   Collection Time: 06/18/23  3:52 AM   Specimen: BLOOD  Result Value Ref Range Status   Specimen Description BLOOD BLOOD LEFT HAND  Final   Special Requests   Final    BOTTLES DRAWN AEROBIC AND ANAEROBIC Blood Culture adequate volume   Culture   Final    NO GROWTH 2 DAYS Performed at [facility], [address], [place], [place] [address]  Report Status PENDING  Incomplete     Time coordinating discharge: Over 30 minutes  SIGNED:   Rema Care [place], DO  [facility] 06/20/2023, 2:28 PM

## 2023-06-20 NOTE — Plan of Care (Signed)
  Problem: Clinical Measurements: Goal: Diagnostic test results will improve Outcome: Progressing   Problem: Clinical Measurements: Goal: Signs and symptoms of infection will decrease Outcome: Progressing   Problem: Respiratory: Goal: Ability to maintain adequate ventilation will improve Outcome: Progressing   

## 2023-06-23 LAB — CULTURE, BLOOD (ROUTINE X 2)
Culture: NO GROWTH
Culture: NO GROWTH
Special Requests: ADEQUATE
Special Requests: ADEQUATE

## 2023-07-29 ENCOUNTER — Other Ambulatory Visit (HOSPITAL_COMMUNITY): Payer: Self-pay | Admitting: Cardiology

## 2023-09-17 ENCOUNTER — Other Ambulatory Visit (HOSPITAL_COMMUNITY): Payer: Self-pay | Admitting: Cardiology
